# Patient Record
Sex: Male | Born: 1955 | Race: Black or African American | Hispanic: No | Smoking: Former smoker
Health system: Southern US, Community
[De-identification: ages and names within clinical notes are randomized; demographics above are authoritative.]

## PROBLEM LIST (undated history)

## (undated) DIAGNOSIS — R040 Epistaxis: Secondary | ICD-10-CM

## (undated) DIAGNOSIS — E785 Hyperlipidemia, unspecified: Secondary | ICD-10-CM

## (undated) DIAGNOSIS — I10 Essential (primary) hypertension: Secondary | ICD-10-CM

## (undated) DIAGNOSIS — Z9119 Patient's noncompliance with other medical treatment and regimen: Secondary | ICD-10-CM

## (undated) DIAGNOSIS — R55 Syncope and collapse: Secondary | ICD-10-CM

## (undated) DIAGNOSIS — Z91199 Patient's noncompliance with other medical treatment and regimen due to unspecified reason: Secondary | ICD-10-CM

## (undated) DIAGNOSIS — I5042 Chronic combined systolic (congestive) and diastolic (congestive) heart failure: Secondary | ICD-10-CM

## (undated) DIAGNOSIS — R5383 Other fatigue: Secondary | ICD-10-CM

## (undated) DIAGNOSIS — I2510 Atherosclerotic heart disease of native coronary artery without angina pectoris: Secondary | ICD-10-CM

## (undated) DIAGNOSIS — I255 Ischemic cardiomyopathy: Secondary | ICD-10-CM

## (undated) HISTORY — DX: Essential (primary) hypertension: I10

## (undated) HISTORY — DX: Syncope and collapse: R55

## (undated) HISTORY — DX: Ischemic cardiomyopathy: I25.5

## (undated) HISTORY — DX: Other fatigue: R53.83

## (undated) HISTORY — DX: Epistaxis: R04.0

## (undated) HISTORY — DX: Atherosclerotic heart disease of native coronary artery without angina pectoris: I25.10

## (undated) HISTORY — DX: Hyperlipidemia, unspecified: E78.5

---

## 2009-01-24 ENCOUNTER — Inpatient Hospital Stay (HOSPITAL_COMMUNITY): Admission: EM | Admit: 2009-01-24 | Discharge: 2009-01-25 | Payer: Self-pay | Admitting: Emergency Medicine

## 2009-01-24 ENCOUNTER — Encounter (INDEPENDENT_AMBULATORY_CARE_PROVIDER_SITE_OTHER): Payer: Self-pay | Admitting: Internal Medicine

## 2009-02-04 ENCOUNTER — Ambulatory Visit: Payer: Self-pay

## 2009-02-04 ENCOUNTER — Ambulatory Visit: Payer: Self-pay | Admitting: Cardiovascular Disease

## 2009-02-07 ENCOUNTER — Telehealth (INDEPENDENT_AMBULATORY_CARE_PROVIDER_SITE_OTHER): Payer: Self-pay | Admitting: *Deleted

## 2009-02-11 ENCOUNTER — Encounter: Payer: Self-pay | Admitting: Internal Medicine

## 2009-02-11 ENCOUNTER — Ambulatory Visit: Payer: Self-pay

## 2009-03-07 ENCOUNTER — Telehealth (INDEPENDENT_AMBULATORY_CARE_PROVIDER_SITE_OTHER): Payer: Self-pay | Admitting: *Deleted

## 2009-03-15 ENCOUNTER — Encounter (INDEPENDENT_AMBULATORY_CARE_PROVIDER_SITE_OTHER): Payer: Self-pay | Admitting: *Deleted

## 2009-06-05 ENCOUNTER — Encounter (INDEPENDENT_AMBULATORY_CARE_PROVIDER_SITE_OTHER): Payer: Self-pay | Admitting: *Deleted

## 2010-03-02 ENCOUNTER — Inpatient Hospital Stay (HOSPITAL_COMMUNITY): Admission: EM | Admit: 2010-03-02 | Discharge: 2010-03-10 | Payer: Self-pay | Admitting: Emergency Medicine

## 2010-03-02 ENCOUNTER — Ambulatory Visit: Payer: Self-pay | Admitting: Cardiology

## 2010-03-03 ENCOUNTER — Ambulatory Visit: Payer: Self-pay | Admitting: Vascular Surgery

## 2010-03-03 ENCOUNTER — Encounter (INDEPENDENT_AMBULATORY_CARE_PROVIDER_SITE_OTHER): Payer: Self-pay | Admitting: Internal Medicine

## 2010-03-05 ENCOUNTER — Encounter: Payer: Self-pay | Admitting: Cardiology

## 2010-03-06 HISTORY — PX: CORONARY ANGIOPLASTY WITH STENT PLACEMENT: SHX49

## 2010-03-10 HISTORY — PX: CARDIAC CATHETERIZATION: SHX172

## 2010-03-12 ENCOUNTER — Encounter: Payer: Self-pay | Admitting: Cardiovascular Disease

## 2010-04-01 ENCOUNTER — Ambulatory Visit: Payer: Self-pay | Admitting: Internal Medicine

## 2010-04-01 DIAGNOSIS — R5381 Other malaise: Secondary | ICD-10-CM | POA: Insufficient documentation

## 2010-04-01 DIAGNOSIS — R5383 Other fatigue: Secondary | ICD-10-CM

## 2010-04-01 DIAGNOSIS — I10 Essential (primary) hypertension: Secondary | ICD-10-CM | POA: Insufficient documentation

## 2010-04-01 DIAGNOSIS — R55 Syncope and collapse: Secondary | ICD-10-CM | POA: Insufficient documentation

## 2010-04-01 DIAGNOSIS — I255 Ischemic cardiomyopathy: Secondary | ICD-10-CM

## 2010-07-07 ENCOUNTER — Ambulatory Visit: Payer: Self-pay | Admitting: Internal Medicine

## 2010-07-07 DIAGNOSIS — F341 Dysthymic disorder: Secondary | ICD-10-CM | POA: Insufficient documentation

## 2010-07-07 DIAGNOSIS — R040 Epistaxis: Secondary | ICD-10-CM | POA: Insufficient documentation

## 2010-07-25 ENCOUNTER — Ambulatory Visit: Payer: Self-pay | Admitting: Cardiology

## 2010-07-25 ENCOUNTER — Encounter: Payer: Self-pay | Admitting: Internal Medicine

## 2010-07-25 ENCOUNTER — Ambulatory Visit (HOSPITAL_COMMUNITY): Admission: RE | Admit: 2010-07-25 | Discharge: 2010-07-25 | Payer: Self-pay | Admitting: Internal Medicine

## 2010-07-25 ENCOUNTER — Ambulatory Visit: Payer: Self-pay

## 2010-09-22 ENCOUNTER — Encounter: Payer: Self-pay | Admitting: Internal Medicine

## 2010-09-23 ENCOUNTER — Ambulatory Visit: Payer: Self-pay | Admitting: Internal Medicine

## 2010-09-23 DIAGNOSIS — R21 Rash and other nonspecific skin eruption: Secondary | ICD-10-CM

## 2010-12-16 NOTE — Assessment & Plan Note (Signed)
Summary: per check out/sf   Visit Type:  Follow-up Primary Provider:  garba  CC:  no complaints.  History of Present Illness: Frank Webb is seen in followup for ischemic heart disease following revascularization in May.   He had a history of syncope for which he underwent EP testing which was negative in the setting of near normal LV function.  Intercurrent echo showed EF 45-50%  His epistaxis has resolved off the Efient  his mood is better; he is reconciled partially with his Ex  although they're not living to gather. Sexual function is improved    Problems Prior to Update: 1)  Hypertension, Heart Uncontrolled w/o Assoc CHF  (ICD-402.00) 2)  Anxiety Depression  (ICD-300.4) 3)  Epistaxis, Recurrent  (ICD-784.7) 4)  Hypertension, Heart Controlled w/o Assoc CHF  (ICD-402.10) 5)  Fatigue  (ICD-780.79) 6)  Syncope  (ICD-780.2) 7)  Cardiomyopathy, Ischemic  (ICD-414.8)  Current Medications (verified): 1)  Aspirin 325 Mg Tabs (Aspirin) .... Once Daily 2)  Hydrochlorothiazide 25 Mg Tabs (Hydrochlorothiazide) .... Take One Tablet Once Daily 3)  Lisinopril 40 Mg Tabs (Lisinopril) .... Take One Tablet Once Daily 4)  Zocor 40 Mg Tabs (Simvastatin) .... At Bedtime  Allergies (verified): No Known Drug Allergies  Past History:  Past Medical History: Last updated: 04/01/2010 coronary artery disease with prior inferior wall infarct and recent RCA stenting with residual two-vessel disease Ejection fraction 35% Syncope with negative EP study Hyperlipidemi Hypertension sexual dysfunction  Family History: Last updated: 03/25/2009 Father:decease age 31 alzheimers Mother:decease age 20 cancer  Social History: Last updated: 03/25/09 Full Time supervisor at [employer]  Tobacco Use - Former. quit 3.18.10 Alcohol Use - yes Regular Exercise - yes Drug Use - no  Risk Factors: Exercise: yes (2009/03/25)  Risk Factors: Smoking Status: quit (03-25-2009)  Vital Signs:  Patient  profile:   55 year old male Height:      69 inches Weight:      199.25 pounds BMI:     29.53 Pulse rate:   78 / minute BP sitting:   199 / 104  (left arm) Cuff size:   regular  Vitals Entered By: Caralee Ates CMA (September 23, 2010 8:48 AM)  Physical Exam  General:  The patient was alert and oriented in no acute distress. HEENT Normal.  Neck veins were flat, carotids were brisk.  Lungs were clear.  Heart sounds were regular without murmurs or gallops.  Abdomen was soft with active bowel sounds. There is no clubbing cyanosis or edema. Skin Warm and dry; there is a pruritic plaque on the dorsum of his left foot and there are vitiligo on his forearms bilaterally.    EKG  Procedure date:  09/23/2010  Findings:      72 Intervals 0.16/0.11/0.40 Axis of 71 Isolated PVC  Impression & Recommendations:  Problem # 1:  HYPERTENSION, HEART UNCONTROLLED W/O ASSOC CHF (ICD-402.00) we'll begin him on amlodipine 10 mg a day. He is to see his PCP later this month. The following medications were removed from the medication list:    Hydrochlorothiazide 25 Mg Tabs (Hydrochlorothiazide) .Marland Kitchen... Take one tablet once daily    Norvasc 10 Mg Tabs (Amlodipine besylate) ..... Once tablet once daily His updated medication list for this problem includes:    Aspirin 325 Mg Tabs (Aspirin) ..... Once daily    Lisinopril-hydrochlorothiazide 20-12.5 Mg Tabs (Lisinopril-hydrochlorothiazide) .Marland Kitchen..Marland Kitchen Two times a day    Amlodipine Besylate 10 Mg Tabs (Amlodipine besylate) ..... Once daily  Problem # 2:  FATIGUE (ICD-780.79) This is somewhat improved. However, given the difficulties that we've had I would like to hold off on beta blocker therapy for a little while longer and potentially reinitiated  Problem # 3:  CARDIOMYOPATHY, ISCHEMIC (ICD-414.8)  ejection fraction 45-50%. No symptoms we'll continue current medications The following medications were removed from the medication list:    Effient 10 Mg Tabs  (Prasugrel hcl) ..... One tablet once daily    Norvasc 10 Mg Tabs (Amlodipine besylate) ..... Once tablet once daily His updated medication list for this problem includes:    Aspirin 325 Mg Tabs (Aspirin) ..... Once daily    Hydrochlorothiazide 25 Mg Tabs (Hydrochlorothiazide) .Marland Kitchen... Take one tablet once daily    Lisinopril-hydrochlorothiazide 20-12.5 Mg Tabs (Lisinopril-hydrochlorothiazide) .Marland Kitchen..Marland Kitchen Two times a day  The following medications were removed from the medication list:    Hydrochlorothiazide 25 Mg Tabs (Hydrochlorothiazide) .Marland Kitchen... Take one tablet once daily    Effient 10 Mg Tabs (Prasugrel hcl) ..... One tablet once daily    Norvasc 10 Mg Tabs (Amlodipine besylate) ..... Once tablet once daily His updated medication list for this problem includes:    Aspirin 325 Mg Tabs (Aspirin) ..... Once daily    Lisinopril-hydrochlorothiazide 20-12.5 Mg Tabs (Lisinopril-hydrochlorothiazide) .Marland Kitchen..Marland Kitchen Two times a day    Amlodipine Besylate 10 Mg Tabs (Amlodipine besylate) ..... Once daily  Problem # 4:  ANXIETY DEPRESSION (ICD-300.4) Tis somewhat improved. Again as noted above it is in the pause prior to the initiation of a beta blocker which is quite important.  Problem # 5:  RASH AND OTHER NONSPECIFIC SKIN ERUPTION (ICD-782.1) the vitiligo appears to be resolving. I wonder whether as a drug eruption. The pruritic plaque is likely fungal. I suggested that he get OTC antifungal. This doesn't work he should follow up with his primary care physician as his anticipated next few weeks  Patient Instructions: 1)  Your physician has recommended you make the following change in your medication: Amlodipine 10mg  daily, Lisinopril/HCTZ two times a day. Stop HCTZ 2)  Your physician wants you to follow-up in:  6 months.  You will receive a reminder letter in the mail two months in advance. If you don't receive a letter, please call our office to schedule the follow-up appointment. Prescriptions: AMLODIPINE  BESYLATE 10 MG TABS (AMLODIPINE BESYLATE) once daily  #90 x 3   Entered by:   Claris Gladden RN   Authorized by:   Nathen May, MD, [facility]   Signed by:   Claris Gladden RN on 09/23/2010   Method used:   Electronically to        [facility] [address] (retail)       [phone]       [place], [place]  [address]       Ph: [phone]       Fax: [phone]   RxID:   [id number] LISINOPRIL-HYDROCHLOROTHIAZIDE 20-12.5 MG TABS (LISINOPRIL-HYDROCHLOROTHIAZIDE) two times a day  #180 x 3   Entered by:   Claris Gladden RN   Authorized by:   Nathen May, MD, [facility]   Signed by:   Claris Gladden RN on 09/23/2010   Method used:   Electronically to        [facility] [address] (retail)       [phone]       [place], [place]  [address]  Ph: [phone]       Fax: [phone]   RxID:   [id number]

## 2010-12-16 NOTE — Assessment & Plan Note (Signed)
Summary: appt @ 9:00/eph/jml   CC:  eph/pt states he has not been doing so well.  He is having headaches frequently and has no energy. Marland Kitchen  History of Present Illness: Frank Webb is seen in followup for syncope in the setting of ischemic heart disease prior myocardial infarction and recent PCI of his RCA with tandem drug-eluting stents. He also underwent EP testing which was negative and he was discharged.  His post discharge course has been complicated by persistent right-sided headaches with nasal drainage as well as fatigue loss of libido and somnolence.  Review of his medications demonstrates that carvedilol amlodipine spironolactone and prsugrel are new; he inadvertently resumed his labetalol at home and has tolerated it in the past as well as his lisinopril and hctz    Current Medications (verified): 1)  Aspirin 325 Mg Tabs (Aspirin) .... Once Daily 2)  Zocor 40 Mg Tabs (Simvastatin) .... Take One Tablet Once Daily 3)  Hydrochlorothiazide 25 Mg Tabs (Hydrochlorothiazide) .... Take One Tablet Once Daily 4)  Lisinopril 40 Mg Tabs (Lisinopril) .... Take One Tablet Once Daily 5)  Effient 10 Mg Tabs (Prasugrel Hcl) .... One Tablet Once Daily 6)  Norvasc 10 Mg Tabs (Amlodipine Besylate) .... Once Tablet Once Daily 7)  Coreg 6.25 Mg Tabs (Carvedilol) .... Take One Tablet Two Times A Day 8)  Spironolactone 25 Mg Tabs (Spironolactone) .... Take One Tablet Once Daily 9)  Labetalol Hcl 100 Mg Tabs (Labetalol Hcl) .... 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 quit 3.18.10 Alcohol Use - yes Regular Exercise - yes Drug Use - no  Past Medical History: coronary artery disease with prior inferior wall infarct and recent RCA stenting  with residual two-vessel disease Ejection fraction 35% Syncope with negative EP study Hyperlipidemi Hypertension sexual dysfunction  Vital Signs:  Patient profile:   55 year old male Height:      69 inches Weight:      192 pounds BMI:     28.46 Pulse rate:   62 / minute Pulse rhythm:   regular BP sitting:   116 / 74  (left arm) Cuff size:   regular  Vitals Entered By: Judithe Modest CMA (Apr 01, 2010 8:47 AM)  Physical Exam  General:  Well developed, well nourished, in no acute distress. Head:  normal  HEENT Neck:  Neck supple,  Lungs:  clear  Heart:  regular rate and rhythm without murmur throat L. Abdomen:  soft with active bowel sounds Msk:  Back normal, normal gait. Muscle strength and tone normal. Pulses:  pulses normal in all 4 extremities Extremities:  no clubbing cyanosis or edema Neurologic:  grossly normal Skin:  warm and dry Psych:  depressed affect.  depressed affect.     EKG  Procedure date:  04/01/2010  Findings:      sinus rhythm at 62 Intervals 0.17/0.11/0.38 Axis LXVIII  Impression & Recommendations:  Problem # 1:  CARDIOMYOPATHY, ISCHEMIC (ICD-414.8) the patient is having significant symptoms tolerating his medications for his ischemic myopathy. We will hold his Aldactone at this point. We'll switch her from carvedilol which is new to labetalol which she had previously tolerated. Right now we will continue him on his amlodipine.  Problem # 2:  HYPERTENSION, HEART CONTROLLED W/O ASSOC CHF (ICD-402.10)  Will continue his labetalol lisinopril and HCTZ and Norvasc for now. Hopefully the discontinuation of his carvedilol aspirin lactone will not adversely affect his blood pressure His updated medication list for this problem includes:    Aspirin 325 Mg Tabs (Aspirin) ..... Once daily    Hydrochlorothiazide 25 Mg Tabs (Hydrochlorothiazide) .Marland Kitchen... Take one tablet once daily    Lisinopril 40 Mg Tabs (Lisinopril) .Marland Kitchen... Take one tablet once daily     Norvasc 10 Mg Tabs (Amlodipine besylate) ..... Once tablet once daily    Spironolactone 25 Mg Tabs (Spironolactone) .Marland Kitchen... Take one tablet once daily- hold  Problem # 3:  UNSPECIFIED SINUSITIS (ICD-473.9) begin amoxicillin x7 days  Problem # 4:  SYNCOPE (ICD-780.2) no intercurrent events His updated medication list for this problem includes:    Aspirin 325 Mg Tabs (Aspirin) ..... Once daily    Lisinopril 40 Mg Tabs (Lisinopril) .Marland Kitchen... Take one tablet once daily    Effient 10 Mg Tabs (Prasugrel hcl) ..... One tablet once daily    Norvasc 10 Mg Tabs (Amlodipine besylate) ..... Once tablet once daily  Problem # 5:  FATIGUE (ICD-780.79) I hope that this is a medication effect related to the carvedilol. We'll attempt to use the labetalol as an alternative. He slid a stone next couple of weeks how he is feeling.  Other Orders: Echocardiogram (Echo)  Patient Instructions: 1)  Your physician has recommended you make the following change in your medication: Stop Carvedilol, Stop Spironolactone for now.  Start Amoxicillin 500mg  1 capsule every 8 hours for 7 days. 2)  Your physician recommends that you schedule a follow-up appointment in: 3 months with Dr Graciela Husbands with repeat echocardiogram. Prescriptions: NORVASC 10 MG TABS (AMLODIPINE BESYLATE) once tablet once daily  #30 x 11   Entered by:   Optometrist BSN   Authorized by:   Nathen May, MD, [facility]   Signed by:   Gypsy Balsam RN BSN on 04/01/2010   Method used:   Tax adviser to        [facility] (retail)       [phone]       [place], [place]  [address]       Ph: [phone]       Fax: [phone]   RxID:   [id number] EFFIENT 10 MG TABS (PRASUGREL HCL) one tablet once daily  #30 x 11   Entered by:   Optometrist BSN   Authorized by:   Nathen May, MD, [facility]   Signed by:   Gypsy Balsam RN BSN on 04/01/2010   Method used:   Tax adviser to        [facility] [employer] [facility] (retail)       [phone]       [place], [place]  [address]       Ph: [phone]       Fax: [phone]   RxID:   [id number] ZOCOR 40 MG TABS (SIMVASTATIN) take one tablet once daily  #30 x 11   Entered by:   Optometrist BSN   Authorized by:   Nathen May, MD, [facility]   Signed by:   Gypsy Balsam RN BSN on 04/01/2010   Method used:   Tax adviser to        [facility] (retail)       [phone]  [place], [place]  [address]       Ph: [phone]       Fax: [phone]   RxID:   [id number] AMOXICILLIN 500 MG CAPS (AMOXICILLIN) Take 1 capsule every 8 hours for 7 days  #21 x 0   Entered by:   Optometrist BSN   Authorized by:   Nathen May, MD, [facility]   Signed by:   Gypsy Balsam RN BSN on 04/01/2010   Method used:   Tax adviser to        [facility] (retail)       [phone]       [place], [place]  [address]       Ph: [phone]       Fax: [phone]   RxID:   [id number]

## 2010-12-16 NOTE — Assessment & Plan Note (Signed)
Summary: 3 month rov echo at 8:30/sl   Primary Provider:  garba  CC:  pt having nosebleeds x 3.  He says last occured 2 weeks ago.  He reports social stress.Marland Kitchen  History of Present Illness: Frank Webb is seen in followup for ischemic heart disease following revascularization in May. He has a series of complaints today the first of which is epistaxis Reviewing DrMcAlhaney notes his recommendation was that his  Effient could be stopped after a month.  He has also been struggling at home. His fiance left in the context of him being sick and sexually inactive. He has started smoking again and described himself as being quite depressed  Current Medications (verified): 1)  Aspirin 325 Mg Tabs (Aspirin) .... Once Daily 2)  Hydrochlorothiazide 25 Mg Tabs (Hydrochlorothiazide) .... Take One Tablet Once Daily 3)  Lisinopril 40 Mg Tabs (Lisinopril) .... Take One Tablet Once Daily 4)  Effient 10 Mg Tabs (Prasugrel Hcl) .... One Tablet Once Daily 5)  Norvasc 10 Mg Tabs (Amlodipine Besylate) .... Once Tablet Once Daily 6)  Zocor 40 Mg Tabs (Simvastatin) .... At Bedtime  Allergies (verified): No Known Drug Allergies  Past History:  Past Medical History: Last updated: 04/01/2010 coronary artery disease with prior inferior wall infarct and recent RCA stenting with residual two-vessel disease Ejection fraction 35% Syncope with negative EP study Hyperlipidemi Hypertension sexual dysfunction  Family History: Last updated: 03-17-2009 Father:decease age 77 alzheimers Mother:decease age 79 cancer  Social History: Last updated: 03-17-2009 Full Time supervisor at [employer]  Tobacco Use - Former. quit 3.18.10 Alcohol Use - yes Regular Exercise - yes Drug Use - no  Vital Signs:  Patient profile:   55 year old male Height:      69 inches Weight:      190 pounds BMI:     28.16 Pulse rate:   75 / minute BP sitting:   180 / 98  (left arm) Cuff size:   regular  Vitals Entered By: Judithe Modest CMA (July 07, 2010 10:03 AM)  Physical Exam  General:  Well developed, well nourished, in no acute distress. Head:  normal  HEENT Neck:  Neck supple,  Lungs:  clear  Heart:  regular rate and rhythm without murmur throat L. Abdomen:  soft with active bowel sounds Msk:  Back normal, normal gait. Muscle strength and tone normal. Extremities:  no clubbing cyanosis or edema Neurologic:  grossly normal, alert and oriented x3 Skin:  Intact without lesions or rashes. Psych:  depressed affect.     EKG  Procedure date:  07/07/2010  Findings:      sinus rhythm at 75 Intervals 0.16 5.10 6.3 Axis is 51 poor R-wave  Impression & Recommendations:  Problem # 1:  EPISTAXIS, RECURRENT (ICD-784.7) the patient has had nosebleeds. Will plan to discontinue his ST and not aware 3 months post intervention. He'll be continued on aspirin  Problem # 2:  HYPERTENSION, HEART CONTROLLED W/O ASSOC CHF (ICD-402.10)  his blood pressure is quite high today. When he saw his primary care physician last week it was 138 though he says. Because of that we will hold off on any current adjustments. I would suggest we followed up with his primary care physician that he has elevated blood pressure that we should try a beta blocker given his history of coronary The following medications were removed from the medication list:    Spironolactone 25 Mg Tabs (Spironolactone) .Marland Kitchen... Take one tablet once daily- hold His updated medication list  for this problem includes:    Aspirin 325 Mg Tabs (Aspirin) ..... Once daily    Hydrochlorothiazide 25 Mg Tabs (Hydrochlorothiazide) .Marland Kitchen... Take one tablet once daily    Lisinopril 40 Mg Tabs (Lisinopril) .Marland Kitchen... Take one tablet once daily    Norvasc 10 Mg Tabs (Amlodipine besylate) ..... Once tablet once daily  Orders: EKG w/ Interpretation (93000)  Problem # 3:  ANXIETY DEPRESSION (ICD-300.4) his affect is flat and he admits to depression I suggest he followup with his  primary care physician to consider antidepressant therapy for 3-6 months. Sexual dysfunction is clearly contributing to this and it may be improved with his depression resolving. Otherwise may need further intervention  Problem # 4:  CARDIOMYOPATHY, ISCHEMIC (ICD-414.8) stable from a coronary point of view The following medications were removed from the medication list:    Spironolactone 25 Mg Tabs (Spironolactone) .Marland Kitchen... Take one tablet once daily- hold His updated medication list for this problem includes:    Aspirin 325 Mg Tabs (Aspirin) ..... Once daily    Hydrochlorothiazide 25 Mg Tabs (Hydrochlorothiazide) .Marland Kitchen... Take one tablet once daily    Lisinopril 40 Mg Tabs (Lisinopril) .Marland Kitchen... Take one tablet once daily    Effient 10 Mg Tabs (Prasugrel hcl) ..... One tablet once daily    Norvasc 10 Mg Tabs (Amlodipine besylate) ..... Once tablet once daily  Orders: Echocardiogram (Echo)  Patient Instructions: 1)  Your physician recommends that you schedule a follow-up appointment in: 3 MONTHS WITH DR Graciela Husbands 2)  Your physician has recommended you make the following change in your medication: STOP EFFIENT 3)  Your physician has requested that you have an echocardiogram.  Echocardiography is a painless test that uses sound waves to create images of your heart. It provides your doctor with information about the size and shape of your heart and how well your heart's chambers and valves are working.  This procedure takes approximately one hour. There are no restrictions for this procedure.

## 2010-12-16 NOTE — Miscellaneous (Signed)
Summary: [facility] Cardiac Progress Note  [facility] Card[facility] Progress Note   Imported By: Roderic Ovens 04/16/2010 10:59:39  _____________________________________________________________________  External Attachment:    Type:   Image     Comment:   External Document

## 2010-12-16 NOTE — Miscellaneous (Signed)
  Clinical Lists Changes  Observations: Added new observation of ECHOINTERP:   Study Conclusions    - Left ventricle: The cavity size was normal. Wall thickness was     increased in a pattern of moderate LVH. Systolic function was     mildly reduced. The estimated ejection fraction was in the range     of 45% to 50%. Mild posterior hypokinesis, basal inferior severe     hypokinesis. Doppler parameters are consistent with abnormal left     ventricular relaxation (grade 1 diastolic dysfunction).   - Aortic valve: There was no stenosis.   - Mitral valve: Mild regurgitation.   - Left atrium: The atrium was mildly dilated.   - Right ventricle: The cavity size was normal. Systolic function was     normal.   - Pulmonary arteries: No complete TR doppler jet so unable to     estimate PA systolic pressure.   - Inferior vena cava: The vessel was normal in size; the     respirophasic diameter changes were in the normal range (= 50%);     findings are consistent with normal central venous pressure.   Impressions:    - Normal LV size with moderate hypertrophy. EF 45-50% with mild     posterior hypokinesis and basal inferior severe hypokinesis.     Normal RV size and systolic function.   Prepared and Electronically Authenticated by    Marca Ancona, MD  (07/25/2010 10:11)      Echocardiogram  Procedure date:  07/25/2010  Findings:        Study Conclusions    - Left ventricle: The cavity size was normal. Wall thickness was     increased in a pattern of moderate LVH. Systolic function was     mildly reduced. The estimated ejection fraction was in the range     of 45% to 50%. Mild posterior hypokinesis, basal inferior severe     hypokinesis. Doppler parameters are consistent with abnormal left     ventricular relaxation (grade 1 diastolic dysfunction).   - Aortic valve: There was no stenosis.   - Mitral valve: Mild regurgitation.   - Left atrium: The atrium was mildly dilated.   -  Right ventricle: The cavity size was normal. Systolic function was     normal.   - Pulmonary arteries: No complete TR doppler jet so unable to     estimate PA systolic pressure.   - Inferior vena cava: The vessel was normal in size; the     respirophasic diameter changes were in the normal range (= 50%);     findings are consistent with normal central venous pressure.   Impressions:    - Normal LV size with moderate hypertrophy. EF 45-50% with mild     posterior hypokinesis and basal inferior severe hypokinesis.     Normal RV size and systolic function.   Prepared and Electronically Authenticated by    Marca Ancona, MD

## 2010-12-16 NOTE — Miscellaneous (Signed)
Summary: Appointment Canceled  Appointment status changed to canceled by [employer] on 07/07/2010 10:27 AM.  Cancellation Comments --------------------- echo dx 414.8/bcbs pending/sl  Appointment Information ----------------------- Appt Type:  CARDIOLOGY ANCILLARY VISIT      Date:  Monday, July 07, 2010      Time:  8:30 AM for 60 min   Urgency:  Routine   Made By:  Hoy Finlay Scheduler  To Visit:  [id number]    Reason:  echo dx 414.8/bcbs pending/sl  Appt Comments ------------- -- 07/07/10 10:27: ([facility]) CANCELED -- echo dx 414.8/bcbs pending/sl -- 04/01/10 10:28: ([facility]) BOOKED -- Routine CARDIOLOGY ANCILLARY VISIT at 07/07/2010 8:30 AM for 60 min echo dx 414.8/bcbs pending/sl -- 04/01/10 9:34: ([facility]) BOOKED -- Routine CARDIOL

## 2011-02-03 LAB — CBC
HCT: 41.1 % (ref 39.0–52.0)
HCT: 44.1 % (ref 39.0–52.0)
MCHC: 32.8 g/dL (ref 30.0–36.0)
MCV: 78.3 fL (ref 78.0–100.0)
MCV: 78.3 fL (ref 78.0–100.0)
MCV: 78.4 fL (ref 78.0–100.0)
Platelets: 215 10*3/uL (ref 150–400)
Platelets: 231 10*3/uL (ref 150–400)
Platelets: 238 10*3/uL (ref 150–400)
Platelets: 257 10*3/uL (ref 150–400)
Platelets: 272 10*3/uL (ref 150–400)
RBC: 5.71 MIL/uL (ref 4.22–5.81)
RDW: 15.5 % (ref 11.5–15.5)
RDW: 16 % — ABNORMAL HIGH (ref 11.5–15.5)
WBC: 7.1 10*3/uL (ref 4.0–10.5)
WBC: 7.1 10*3/uL (ref 4.0–10.5)

## 2011-02-03 LAB — BASIC METABOLIC PANEL
BUN: 10 mg/dL (ref 6–23)
BUN: 10 mg/dL (ref 6–23)
BUN: 12 mg/dL (ref 6–23)
BUN: 6 mg/dL (ref 6–23)
BUN: 7 mg/dL (ref 6–23)
CO2: 30 mEq/L (ref 19–32)
CO2: 30 mEq/L (ref 19–32)
Calcium: 8.8 mg/dL (ref 8.4–10.5)
Calcium: 9 mg/dL (ref 8.4–10.5)
Calcium: 9.2 mg/dL (ref 8.4–10.5)
Chloride: 102 mEq/L (ref 96–112)
Chloride: 103 mEq/L (ref 96–112)
Chloride: 103 mEq/L (ref 96–112)
Chloride: 104 mEq/L (ref 96–112)
Creatinine, Ser: 1 mg/dL (ref 0.4–1.5)
Creatinine, Ser: 1.01 mg/dL (ref 0.4–1.5)
Creatinine, Ser: 1.07 mg/dL (ref 0.4–1.5)
Creatinine, Ser: 1.1 mg/dL (ref 0.4–1.5)
Creatinine, Ser: 1.13 mg/dL (ref 0.4–1.5)
Creatinine, Ser: 1.23 mg/dL (ref 0.4–1.5)
GFR calc Af Amer: >60 mL/min (ref >60)
GFR calc Af Amer: >60 mL/min (ref >60)
GFR calc Af Amer: >60 mL/min (ref >60)
GFR calc non Af Amer: >60 mL/min (ref >60)
GFR calc non Af Amer: >60 mL/min (ref >60)
GFR calc non Af Amer: >60 mL/min (ref >60)
GFR calc non Af Amer: >60 mL/min (ref >60)
Glucose, Bld: 126 mg/dL — ABNORMAL HIGH (ref 70–99)
Glucose, Bld: 94 mg/dL (ref 70–99)
Glucose, Bld: 95 mg/dL (ref 70–99)
Potassium: 3.7 mEq/L (ref 3.5–5.1)
Sodium: 137 mEq/L (ref 135–145)
Sodium: 138 mEq/L (ref 135–145)

## 2011-02-03 LAB — POCT CARDIAC MARKERS
CKMB, poc: 1.9 ng/mL (ref 1.0–8.0)
Myoglobin, poc: 100 ng/mL (ref 12–200)
Troponin i, poc: <0.05 ng/mL (ref 0.00–0.09)

## 2011-02-03 LAB — CK TOTAL AND CKMB (NOT AT ARMC): CK, MB: 2.5 ng/mL (ref 0.3–4.0)

## 2011-02-03 LAB — DIFFERENTIAL
Basophils Relative: 0 % (ref 0–1)
Blasts: 0 %
Lymphocytes Relative: 33 % (ref 12–46)
Lymphs Abs: 2.3 10*3/uL (ref 0.7–4.0)
Metamyelocytes Relative: 0 %
Monocytes Absolute: 0.9 10*3/uL (ref 0.1–1.0)
Myelocytes: 0 %
Neutro Abs: 3.8 10*3/uL (ref 1.7–7.7)
Neutrophils Relative %: 52 % (ref 43–77)
nRBC: 0 /100 WBC

## 2011-02-03 LAB — LIPID PANEL
HDL: 36 mg/dL — ABNORMAL LOW (ref >39)
LDL Cholesterol: 170 mg/dL — ABNORMAL HIGH (ref 0–99)
Total CHOL/HDL Ratio: 6.4 RATIO
Triglycerides: 121 mg/dL (ref <150)
VLDL: 24 mg/dL (ref 0–40)

## 2011-02-03 LAB — URINE DRUGS OF ABUSE SCREEN W ALC, ROUTINE (REF LAB)
Benzodiazepines.: NEGATIVE
Marijuana Metabolite: NEGATIVE
Opiate Screen, Urine: NEGATIVE
Phencyclidine (PCP): NEGATIVE
Propoxyphene: NEGATIVE

## 2011-02-03 LAB — TSH: TSH: 1.601 u[IU]/mL (ref 0.350–4.500)

## 2011-02-03 LAB — URINALYSIS, ROUTINE W REFLEX MICROSCOPIC
Bilirubin Urine: NEGATIVE
Nitrite: NEGATIVE

## 2011-02-03 LAB — CARDIAC PANEL(CRET KIN+CKTOT+MB+TROPI)
CK, MB: 3.5 ng/mL (ref 0.3–4.0)
Relative Index: 1.7 (ref 0.0–2.5)
Troponin I: 0.09 ng/mL — ABNORMAL HIGH (ref 0.00–0.06)
Troponin I: 0.09 ng/mL — ABNORMAL HIGH (ref 0.00–0.06)

## 2011-02-03 LAB — MAGNESIUM
Magnesium: 2.2 mg/dL (ref 1.5–2.5)
Magnesium: 2.3 mg/dL (ref 1.5–2.5)

## 2011-02-03 LAB — URINE CULTURE: Colony Count: NO GROWTH

## 2011-02-03 LAB — URINE MICROSCOPIC-ADD ON

## 2011-02-03 LAB — HEPATIC FUNCTION PANEL
Total Bilirubin: 0.6 mg/dL (ref 0.3–1.2)
Total Protein: 6.8 g/dL (ref 6.0–8.3)

## 2011-02-03 LAB — ALDOSTERONE + RENIN ACTIVITY W/ RATIO
ALDO / PRA Ratio: 42.1 Ratio — ABNORMAL HIGH (ref 0.9–28.9)
Aldosterone: 8 ng/dL

## 2011-02-03 LAB — PROTIME-INR
INR: 0.98 (ref 0.00–1.49)
Prothrombin Time: 12.9 seconds (ref 11.6–15.2)

## 2011-02-03 LAB — TROPONIN I: Troponin I: 0.09 ng/mL — ABNORMAL HIGH (ref 0.00–0.06)

## 2011-02-26 LAB — BASIC METABOLIC PANEL
Calcium: 9.2 mg/dL (ref 8.4–10.5)
GFR calc Af Amer: >60 mL/min (ref >60)
GFR calc non Af Amer: >60 mL/min (ref >60)
Potassium: 3.8 mEq/L (ref 3.5–5.1)
Sodium: 140 mEq/L (ref 135–145)

## 2011-02-26 LAB — POCT CARDIAC MARKERS
CKMB, poc: 1.5 ng/mL (ref 1.0–8.0)
Myoglobin, poc: 56.6 ng/mL (ref 12–200)
Troponin i, poc: <0.05 ng/mL (ref 0.00–0.09)

## 2011-02-26 LAB — CARDIAC PANEL(CRET KIN+CKTOT+MB+TROPI)
CK, MB: 2 ng/mL (ref 0.3–4.0)
CK, MB: 2.2 ng/mL (ref 0.3–4.0)
Total CK: 112 U/L (ref 7–232)
Total CK: 116 U/L (ref 7–232)
Troponin I: 0.1 ng/mL — ABNORMAL HIGH (ref 0.00–0.06)

## 2011-02-26 LAB — DIFFERENTIAL
Basophils Absolute: 0 10*3/uL (ref 0.0–0.1)
Basophils Relative: 0 % (ref 0–1)
Eosinophils Absolute: 0.1 10*3/uL (ref 0.0–0.7)
Eosinophils Relative: 1 % (ref 0–5)
Lymphocytes Relative: 27 % (ref 12–46)
Lymphocytes Relative: 49 % — ABNORMAL HIGH (ref 12–46)
Monocytes Relative: 8 % (ref 3–12)
Neutro Abs: 3.1 10*3/uL (ref 1.7–7.7)
Neutro Abs: 3.7 10*3/uL (ref 1.7–7.7)
Neutrophils Relative %: 42 % — ABNORMAL LOW (ref 43–77)

## 2011-02-26 LAB — CBC
HCT: 43.3 % (ref 39.0–52.0)
HCT: 48.2 % (ref 39.0–52.0)
Hemoglobin: 15.4 g/dL (ref 13.0–17.0)
MCHC: 32.3 g/dL (ref 30.0–36.0)
MCV: 77.4 fL — ABNORMAL LOW (ref 78.0–100.0)
Platelets: 223 10*3/uL (ref 150–400)
RBC: 6.18 MIL/uL — ABNORMAL HIGH (ref 4.22–5.81)
WBC: 6.2 10*3/uL (ref 4.0–10.5)
WBC: 7.4 10*3/uL (ref 4.0–10.5)

## 2011-02-26 LAB — COMPREHENSIVE METABOLIC PANEL
Albumin: 3.5 g/dL (ref 3.5–5.2)
BUN: 8 mg/dL (ref 6–23)
CO2: 27 mEq/L (ref 19–32)
Calcium: 8.8 mg/dL (ref 8.4–10.5)
Chloride: 103 mEq/L (ref 96–112)
Creatinine, Ser: 1.04 mg/dL (ref 0.4–1.5)
GFR calc non Af Amer: >60 mL/min (ref >60)
Total Bilirubin: 1 mg/dL (ref 0.3–1.2)

## 2011-02-26 LAB — HEMOGLOBIN A1C: Hgb A1c MFr Bld: 5.6 % (ref 4.6–6.1)

## 2011-02-26 LAB — LIPID PANEL
HDL: 37 mg/dL — ABNORMAL LOW (ref >39)
Total CHOL/HDL Ratio: 6.7 RATIO
VLDL: 22 mg/dL (ref 0–40)

## 2011-02-26 LAB — ETHANOL: Alcohol, Ethyl (B): <5 mg/dL (ref 0–10)

## 2011-03-31 NOTE — Discharge Summary (Signed)
NAMEMARKE, Frank Webb                 ACCOUNT NO.:  [id number]   MEDICAL RECORD NO.:  [id number]          PATIENT TYPE:  INP   LOCATION:  1240                         FACILITY:  [facility]   PHYSICIAN:  Hillery Aldo, M.D.   DATE OF BIRTH:  1956/05/18   DATE OF ADMISSION:  01/23/2009  DATE OF DISCHARGE:  01/25/2009                               DISCHARGE SUMMARY   PRIMARY CARE PHYSICIAN:  None.   The patient will be referred to Dr. Lonia Blood for primary care and to  Dr. Eden Emms for cardiology care.   DISCHARGE DIAGNOSES:  1. Hypertensive urgency.  2. Dyslipidemia.  3. Alcohol dependency.  4. Elevated troponin I.  5. Diastolic dysfunction.  6. Tobacco abuse.  7. Nonspecific T-wave abnormality on 12-lead EKG testing.   DISCHARGE MEDICATIONS:  1. Aspirin 81 mg p.o. daily.  2. Hydrochlorothiazide 25 mg p.o. daily.  3. Lisinopril 10 mg p.o. daily.  4. Labetalol 200 mg p.o. q.12 h  5. Zocor 40 mg p.o. q.p.m.   CONSULTATIONS:  None.   BRIEF ADMISSION HISTORY OF PRESENT ILLNESS:  The patient is a 55-year-  old male with past medical history of elevated blood pressure who  presented to the emergency department in hypertensive urgency with a  blood pressure documented at 250/134.  The patient had not been taking  his medications and was not currently under the care of primary care  physician.  He had been on hydrochlorothiazide and ACE inhibitor but  discontinued these back in August 2009.  Upon initial evaluation, the  patient was put on an IV labetalol drip and admitted to the hospitalist  service.  For full details, please see the dictated report done by Dr.  Ninfa Linden.   PROCEDURES AND DIAGNOSTIC STUDIES:  1. Chest x-ray on January 24, 2009, showed mild cardiomegaly, slight      hyperaeration, no active lung disease.  2. Two-dimensional echocardiogram done on January 24, 2009, showed      normal left ventricular systolic function with ejection fraction      estimated 55-60%.  No left  ventricular regional wall motion      abnormality.  Left ventricular wall thickness was moderately      increased.  Doppler parameters were consistent with abnormal left      ventricular relaxation.  There was mild mitral valvular      regurgitation.   DISCHARGE LABORATORY VALUES:  CK was 112, CK-MB 2.0, troponin I 0.09.  TSH was 2.406.  Hemoglobin A1c was 5.6%.  Lipids showed a cholesterol of  248, triglycerides 112, HDL 37, LDL 189.  Liver function studies were  within normal limits.   HOSPITAL COURSE:  1. Hypertensive urgency:  The patient was initially put on a labetalol      drip.  This was titrated off, and the patient's usual      antihypertensives were resumed including hydrochlorothiazide and      lisinopril.  The patient's blood pressure steadily rose, and he was      put on p.o. labetalol and will need close follow-up.  He has been  obstructed to call Dr. Maxwell Caul office for an appointment time to be      seen within one week.  Additionally, Dr. Mikeal Hawthorne was left a message      regarding contacting this patient for follow-up.  2. Dyslipidemia:  The patient's dyslipidemia has been addressed with      dietitian counseling and statin therapy.  He will need a follow-up      fasting lipid panel and liver function studies in six week's time      done at Dr. Maxwell Caul office.  The patient has been advised of this.  3. Alcohol dependency.  The patient is a heavy alcohol user, but did      not develop any signs of withdrawal.  He is provided with Ativan as-      needed, but did not require any of this.  He was also supplemented      with thiamine and folic acid.  4. Elevated troponin I.  The patient denied any chest pain and his      troponin I elevation was likely due to diastolic dysfunction in the      setting of hypertensive urgency.  A two-dimensional echocardiogram      did not reveal any left ventricular dysfunction or regional wall      motion abnormalities.  Nevertheless,  given his significant risk      factors being male sex, untreated hypertension, and dyslipidemia, I      have set him up for an outpatient stress test with Dr. Eden Emms.  Of      note, the patient also had a 12-lead EKG that showed normal sinus      rhythm with premature ventricular complexes or fusion complexes,      and nonspecific T-wave abnormalities.  Specifically, there were T-      wave inversions in V5 and V6 as well as lead I and aVL.  5. Tobacco abuse.  The patient was counseled on the importance of      tobacco cessation given his underlying comorbidities.  He was      offered a nicotine patch but declined this.  He has been encouraged      to maintain smoking cessation efforts.   DISPOSITION:  The patient is medically stable and will be discharged  home.  He has hospital follow-up with Dr. Eden Emms already scheduled for  stress testing on March 22 at 3:15 p.m.  He will call Dr. Mikeal Hawthorne for a  follow-up appointment time and to establish primary care.   Time spent coordinating care for discharge and discharge instructions  equals 35 minutes.      Hillery Aldo, M.D.  Electronically Signed     CR/MEDQ  D:  01/25/2009  T:  01/25/2009  Job:  [id number]   cc:   Lonia Blood, M.D.   Noralyn Pick. Eden Emms, MD, [facility]  [address]  [address]  [place]  [place] [address]

## 2011-03-31 NOTE — H&P (Signed)
NAMEROEN, MACGOWAN                 ACCOUNT NO.:  [id number]   MEDICAL RECORD NO.:  [id number]          PATIENT TYPE:  EMS   LOCATION:  ED                           FACILITY:  [facility]   PHYSICIAN:  Oswald Hillock, MD        DATE OF BIRTH:  1956-01-04   DATE OF ADMISSION:  01/23/2009  DATE OF DISCHARGE:                              HISTORY & PHYSICAL   PRIMARY CARE PHYSICIAN:  None.   CHIEF COMPLAINT:  Elevated blood pressure.   HISTORY OF PRESENT ILLNESS:  The patient is a 55 year old African  American male with longstanding history of hypertension who has been  noncompliant with his medications since August of last year.  He  presents to the emergency room after he went to his primary care  physician's office today to get prescriptions renewed for high blood  pressure.  However, he was found to have a blood pressure of 250/134,  and was referred here for further evaluation and management.  In the  emergency room, his initial systolic blood pressure was more than 240.  He was given multiple IV boluses of labetalol and started on an IV drip.  At the time of this interview, his blood pressure is significantly  improved with a systolic of 180.  The patient does not report any chest  pain, shortness of breath, palpitations, dizziness, diaphoresis, loss of  consciousness or any focal weakness of any part of the body and had none  of these symptoms earlier today either.  The patient states that he has  been essentially asymptomatic since going off medications in August  2009.   PAST MEDICAL HISTORY:  Hypertension.   PAST SURGICAL HISTORY:  None.   CURRENT MEDICATIONS:  None.   ALLERGIES:  NO KNOWN DRUG ALLERGIES.   FAMILY HISTORY:  Significant for diabetes and hypertension.   SOCIAL HISTORY:  The patient smokes a pack per day and has a 20 pack-  year history of smoking.  He drinks alcohol on a daily basis, especially  in the recent past.  No history of drug use.  He lives with family  and  works for [employer].   REVIEW OF SYSTEMS:  An extensive review of systems was done and all  systems were negative except for the positives mentioned in the history  of present illness.   PHYSICAL EXAMINATION:  VITAL SIGNS:  On admission, pulse of 84, blood  pressure 243/138, respiratory rate of 20, temperature of 98.1.  O2 sats  of 98% on 2 liters nasal cannula.  GENERAL:  The patient is conscious, alert and oriented to time, place  and person in no acute distress.  HEENT:  No scleral icterus.  No  conjunctival congestion.  No pallor.  Ears negative.  Poor dental  hygiene.  NECK:  Supple.  No lymphadenopathy.  No JVD.  No carotid bruit.  CHEST:  CTA bilaterally.  CVS:  S1-S2 regular.  No gallop, rub or murmur appreciated.  ABDOMEN:  Soft, nontender, nondistended.  Bowel sounds present.  Obese.  EXTREMITIES:  No cyanosis, clubbing or edema.  NEUROLOGIC:  Cranial nerves II-XII are grossly intact.  No focal motor  or sensory deficits noted on gross examination.  Fundus examination did  not reveal any hemorrhages.  Optic disks was clear.   LABORATORY DATA:  WBC count 7.4, hemoglobin 15.4, hematocrit 48.2,  platelet count of 254.  Sodium 140, potassium 3.8, chloride 106, CO2 of  25, glucose 96, BUN 9, creatinine 1.06.  BNP was 75.  Myoglobin 56.6, CK-  MB 1.5, troponin less than 0.05.  EKG showed sinus rhythm with  occasional PVCs, normal PR interval, widened QRS complex, poor R-wave  progression in anterior leads, T inversions in leads V5-V6.  No previous  EKGs available for comparison.  Chest x-ray pending.   IMPRESSION AND PLAN:  This is the case of a 55 year old African American  male with history of hypertension, noncompliance with medications who  presents with hypertensive urgency.   1. Hypertensive urgency.  The patient has responded well to      intravenous labetalol and is currently on a drip.  As he is      asymptomatic, we will continue him on labetalol at 2 mg per  minute,      titrate him to a systolic blood pressure of less than 150, hold for      heart rate less than 55 and recycle his enzymes.  Initial enzymes      were negative.  The patient was on lisinopril/hydrochlorothiazide      in the past.  We will restart him, give him a dose now and the      patient will be titrated off labetalol by giving him a loading dose      of p.o. labetalol.  Will monitor the patient closely on telemetry      overnight and make further recommendations based on how he does.  2. Alcohol abuse.  The patient has been drinking alcohol on a daily      basis of late.  We will check his alcohol level, put him on Ativan      for possible DT prophylaxis.  Give him a dose of thiamine and      folate and monitor closely.  3. Health maintenance.  We will check his fasting lipid panel and      hemoglobin A1c especially given his family history of diabetes      mellitus and a TSH level.  Follow up with the results.  4. Deep venous thrombosis/gastrointestinal prophylaxis.  Pepcid and      subcu heparin.      Oswald Hillock, MD  Electronically Signed     BA/MEDQ  D:  01/24/2009  T:  01/24/2009  Job:  [id number]

## 2012-03-23 ENCOUNTER — Encounter: Payer: Self-pay | Admitting: Physician Assistant

## 2012-03-23 ENCOUNTER — Encounter: Payer: Self-pay | Admitting: *Deleted

## 2012-03-23 ENCOUNTER — Ambulatory Visit (INDEPENDENT_AMBULATORY_CARE_PROVIDER_SITE_OTHER): Payer: BC Managed Care – HMO | Admitting: Physician Assistant

## 2012-03-23 VITALS — BP 178/104 | HR 60 | Ht 70.0 in | Wt 197.0 lb

## 2012-03-23 DIAGNOSIS — E785 Hyperlipidemia, unspecified: Secondary | ICD-10-CM

## 2012-03-23 DIAGNOSIS — F17200 Nicotine dependence, unspecified, uncomplicated: Secondary | ICD-10-CM

## 2012-03-23 DIAGNOSIS — E669 Obesity, unspecified: Secondary | ICD-10-CM

## 2012-03-23 DIAGNOSIS — I119 Hypertensive heart disease without heart failure: Secondary | ICD-10-CM

## 2012-03-23 DIAGNOSIS — Z720 Tobacco use: Secondary | ICD-10-CM | POA: Insufficient documentation

## 2012-03-23 DIAGNOSIS — I2510 Atherosclerotic heart disease of native coronary artery without angina pectoris: Secondary | ICD-10-CM

## 2012-03-23 DIAGNOSIS — I2589 Other forms of chronic ischemic heart disease: Secondary | ICD-10-CM

## 2012-03-23 LAB — CBC WITH DIFFERENTIAL/PLATELET
Basophils Relative: 0.4 % (ref 0.0–3.0)
Eosinophils Relative: 1.8 % (ref 0.0–5.0)
HCT: 46.8 % (ref 39.0–52.0)
Hemoglobin: 14.8 g/dL (ref 13.0–17.0)
Lymphs Abs: 1.9 10*3/uL (ref 0.7–4.0)
MCV: 77.9 fl — ABNORMAL LOW (ref 78.0–100.0)
Monocytes Absolute: 0.5 10*3/uL (ref 0.1–1.0)
Monocytes Relative: 8.9 % (ref 3.0–12.0)
Neutro Abs: 2.9 10*3/uL (ref 1.4–7.7)
Platelets: 266 10*3/uL (ref 150.0–400.0)
WBC: 5.3 10*3/uL (ref 4.5–10.5)

## 2012-03-23 LAB — BASIC METABOLIC PANEL
BUN: 11 mg/dL (ref 6–23)
Chloride: 100 mEq/L (ref 96–112)
GFR: 82.97 mL/min (ref >60.00)
Potassium: 3.7 mEq/L (ref 3.5–5.1)
Sodium: 138 mEq/L (ref 135–145)

## 2012-03-23 LAB — TSH: TSH: 1.34 u[IU]/mL (ref 0.35–5.50)

## 2012-03-23 MED ORDER — AMLODIPINE BESYLATE 10 MG PO TABS: 10.0000 mg | ORAL_TABLET | Freq: Every day | ORAL | Status: DC

## 2012-03-23 MED ORDER — LISINOPRIL-HYDROCHLOROTHIAZIDE 20-12.5 MG PO TABS: 2.0000 | ORAL_TABLET | Freq: Every day | ORAL | Status: DC

## 2012-03-23 MED ORDER — SIMVASTATIN 40 MG PO TABS: 40.0000 mg | ORAL_TABLET | Freq: Every day | ORAL | Status: DC

## 2012-03-23 MED ORDER — CLONIDINE HCL 0.1 MG PO TABS
0.1000 mg | ORAL_TABLET | ORAL | Status: AC
  Administered 2012-03-23: 0.1 mg via ORAL

## 2012-03-23 NOTE — Progress Notes (Signed)
[address] [place], [place]  [address] Phone: [phone] Fax:  [phone]  Date:  03/23/2012   Name:  Frank Webb   DOB:  Jun 10, 1956   MRN:  [id number]  PCP:  No primary provider on file.  Primary Cardiologist/Primary Electrophysiologist:  Dr. Sherryl Manges    History of Present Illness: Frank Webb is a 56 y.o. male returns for follow up.  He has a history of CAD, ischemic cardiomyopathy, hypertension, hyperlipidemia, tobacco abuse and syncope.  He was evaluated 02/2010 with a stress test that indicated small inferolateral infarct with significant peri-infarct ischemia and ejection fraction of 36%.  LHC 03/06/10: Serial proximal, mid and distal LAD 40%, apical LAD 60%, dCFX serial 70%, OM2 with chronic occlusion, small oOM3 80%, pRCA 40%, mRCA 80%, dRCA 80%, PDA serial 70%, EF 35%.  PCI: Overlapping BMS x 2 (2.75 x 32 mm Veriflex and 2.75 x 16 mm Veriflex) to the mid and distal RCA.  He had improved LV function on follow up echo 07/2010: Moderate LVH, EF 45-50%, mild posterior HK, severe inferior HK, grade 1 diastolic dysfunction, mild MR, mild LAE.  Last seen by Dr. Graciela Husbands 09/2010.  Of note, the patient has a history of EP testing for unexplained syncope that was negative in the past.  He had problems with epistaxis on Effient.  He also had trouble with fatigue in the past and his beta blocker was discontinued.    Notes his BP has been high.  He quit smoking and was exercising.  BP was better and he stopped his medications.  He recently started back to smoking and quit exercising.  Restarted his Lisinopril/HCT 3 weeks ago.  He went for his [employer] physical for his job yesterday and his BP was 199/100.  He denies headaches, blurry vision, unilateral weakness.  The patient denies chest pain, shortness of breath, syncope, orthopnea, PND or significant pedal edema.  States he "feels great."  He is watching his salt intake.    Wt Readings from Last 3 Encounters:  09/23/10 199 lb  4 oz (90.379 kg)  07/07/10 190 lb (86.183 kg)  04/01/10 192 lb (87.091 kg)     Potassium  Date/Time Value Range Status  03/09/2010  5:05 AM 3.9  3.5-5.1 (mEq/L) Final     Creatinine, Ser  Date/Time Value Range Status  03/09/2010  5:05 AM 1.07  0.4-1.5 (mg/dL) Final     ALT  Date/Time Value Range Status  03/02/2010 12:51 AM 14  0-53 (U/L) Final    Past Medical History  Diagnosis Date  . Hypertension   . Hyperlipidemia   . Anxiety and depression   . Epistaxis, recurrent   . Fatigue     beta blocker d/c'd  . Syncope     negative EP study 02/2010  . Cardiomyopathy, ischemic     EF 35% in 02/2010 prior to PCI;  echo 07/2010: Moderate LVH, EF 45-50%, mild posterior HK, severe inferior HK, grade 1 diastolic dysfunction, mild MR, mild LAE  . Coronary artery disease     nuclear 2011 - small inferolateral infarct with significant peri-infarct ischemia and ejection fraction of 36%.  LHC 03/06/10: Serial proximal, mid and distal LAD 40%, apical LAD 60%, dCFX serial 70%, OM2 with chronic occlusion, small oOM3 80%, pRCA 40%, mRCA 80%, dRCA 80%, PDA serial 70%, EF 35%.  PCI: Overlapping BMS x 2 (2.75 x 32 mm Veriflex and 2.75 x 16 mm Veriflex) to RCA  . Myocardial infarction, inferior wall  post RCA stenting 03/06/2010     Current Outpatient Prescriptions  Medication Sig Dispense Refill  . aspirin 325 MG tablet Take 325 mg by mouth daily.      Marland Kitchen lisinopril-hydrochlorothiazide (PRINZIDE,ZESTORETIC) 20-12.5 MG per tablet Take 2 tablets by mouth daily.        Allergies: Allergies not on file  History  Substance Use Topics  . Smoking status: Not on file  . Smokeless tobacco: Not on file  . Alcohol Use: Not on file     ROS:  Please see the history of present illness.    All other systems reviewed and negative.   PHYSICAL EXAM: VS:  BP 178/104  Pulse 60  Ht 5\' 10"  (1.778 m)  Wt 197 lb (89.359 kg)  BMI 28.27 kg/m2 Repeat BP by me 194/112 bilaterally   Well nourished, well  developed, in no acute distress HEENT: normal Neck: no JVD Vascular: no carotid bruits bilaterally  Cardiac:  normal S1, S2; RRR; no murmur Lungs:  clear to auscultation bilaterally, no wheezing, rhonchi or rales Abd: soft, nontender, no hepatomegaly Ext: no edema Skin: warm and dry Neuro:  CNs 2-12 intact, no focal abnormalities noted Psych: normal affect   EKG:  Sinus rhythm, heart rate 60, normal axis, LVH, biphasic T waves in leads 3, aVF, V5, V6   ASSESSMENT AND PLAN:  1.  Hypertension   -  Uncontrolled.   -  Given Clonidine 0.1 mg x 1 in the office - he was observed for 20 minutes and BP came down to 160/100 prior to d/c.   -  Continue Lisinopril/HCT at current dose.   -  Add Amlodipine 10 mg daily.   -  Check BMET, CBC, TSH today.   -  Follow up with me in one week.    2.  Coronary Artery Disease   -  No angina.  Continue ASA.  Restart statin.    3.  Ischemic Cardiomyopathy   -  No signs of volume overload.  Patient could not tolerate beta blockers in past and HR 60 today.     -  Continue ACE.   -  Consider Hydralazine and Nitrates if BP remains elevated.  4.  Hyperlipidemia   -  Restart Simvastatin 40 mg QHS.   -  Check FLP and LFTs in 6-8 weeks.  5.  Obesity   -  Consider sleep testing in the future.  6.  Tobacco Abuse   -  He is trying to quit.     Signed, Tereso Newcomer, PA-C  10:18 AM 03/23/2012

## 2012-03-23 NOTE — Patient Instructions (Signed)
Your physician recommends that you schedule a follow-up appointment in: 1 WEEK WITH SCOTT WEAVER, [facility]  Your physician has recommended you make the following change in your medication: START AMLODIPINE 10 MG DAILY; SIMVASTATIN 40 MG EVERY NIGHT  Your physician recommends that you return for lab work in: TODAY BMET, CBC W/DIFF, TSH  Your physician recommends that you return for lab work in: 6-8 WEEKS FOR FASTING LIPID/LIVER PANEL 272.4

## 2012-03-24 ENCOUNTER — Telehealth: Payer: Self-pay | Admitting: *Deleted

## 2012-03-24 NOTE — Telephone Encounter (Signed)
Message copied by Tarri Fuller on Thu Mar 24, 2012 10:38 AM ------      Message from: [place], [place] T      Created: Wed Mar 23, 2012  5:38 PM       Please notify patient that the lab results are ok.      Tereso Newcomer, PA-C  5:38 PM 03/23/2012

## 2012-03-24 NOTE — Telephone Encounter (Signed)
pt notified of lab results today 

## 2012-04-01 ENCOUNTER — Ambulatory Visit (INDEPENDENT_AMBULATORY_CARE_PROVIDER_SITE_OTHER): Payer: BC Managed Care – HMO | Admitting: Physician Assistant

## 2012-04-01 ENCOUNTER — Encounter: Payer: Self-pay | Admitting: Physician Assistant

## 2012-04-01 VITALS — BP 186/102 | HR 86 | Ht 70.0 in | Wt 196.0 lb

## 2012-04-01 DIAGNOSIS — I2510 Atherosclerotic heart disease of native coronary artery without angina pectoris: Secondary | ICD-10-CM

## 2012-04-01 DIAGNOSIS — F17200 Nicotine dependence, unspecified, uncomplicated: Secondary | ICD-10-CM

## 2012-04-01 DIAGNOSIS — E785 Hyperlipidemia, unspecified: Secondary | ICD-10-CM

## 2012-04-01 DIAGNOSIS — Z720 Tobacco use: Secondary | ICD-10-CM

## 2012-04-01 DIAGNOSIS — I10 Essential (primary) hypertension: Secondary | ICD-10-CM

## 2012-04-01 MED ORDER — HYDRALAZINE HCL 25 MG PO TABS: 25.0000 mg | ORAL_TABLET | Freq: Three times a day (TID) | ORAL | Status: DC

## 2012-04-01 NOTE — Progress Notes (Signed)
[address] [place], [place]  [address] Phone: [phone] Fax:  [phone]  Date:  04/01/2012   Name:  Frank Webb   DOB:  [birth year]   MRN:  [id number]  PCP:  No primary provider on file.  Primary Cardiologist/Primary Electrophysiologist:  Dr. Sherryl Manges    History of Present Illness: Frank Webb is a 56 y.o. male returns for follow up on BP.  He has a history of CAD, ischemic cardiomyopathy, hypertension, hyperlipidemia, tobacco abuse and syncope.  He was evaluated 02/2010 with a stress test that indicated small inferolateral infarct with significant peri-infarct ischemia and ejection fraction of 36%.  LHC 03/06/10: Serial proximal, mid and distal LAD 40%, apical LAD 60%, dCFX serial 70%, OM2 with chronic occlusion, small oOM3 80%, pRCA 40%, mRCA 80%, dRCA 80%, PDA serial 70%, EF 35%.  PCI: Overlapping BMS x 2 (2.75 x 32 mm Veriflex and 2.75 x 16 mm Veriflex) to the mid and distal RCA.  He had improved LV function on follow up echo 07/2010: Moderate LVH, EF 45-50%, mild posterior HK, severe inferior HK, grade 1 diastolic dysfunction, mild MR, mild LAE.  Last seen by Dr. Graciela Husbands 09/2010.  Of note, the patient has a history of EP testing for unexplained syncope that was negative in the past.  He had problems with epistaxis on Effient.  He also had trouble with fatigue in the past and his beta blocker was discontinued.    I saw him 5/8 in follow up for his BP.  He had been off BP meds and had started smoking again.  He is on max dose ACE/thiazide.  I added amlodipine back to his medical regimen.  Statin was restarted as well.  He is taking all of his medications.  He is still trying to quit smoking.  He is frustrated about his BPs.  Otherwise, feels ok.  The patient denies chest pain, shortness of breath, syncope, orthopnea, PND or significant pedal edema.   Wt Readings from Last 3 Encounters:  04/01/12 196 lb (88.905 kg)  03/23/12 197 lb (89.359 kg)  09/23/10 199 lb  4 oz (90.379 kg)     Potassium  Date/Time Value Range Status  03/23/2012 11:39 AM 3.7  3.5-5.1 (mEq/L) Final     Creatinine, Ser  Date/Time Value Range Status  03/23/2012 11:39 AM 1.2  0.4-1.5 (mg/dL) Final   Hemoglobin  Date/Time Value Range Status  03/23/2012 11:39 AM 14.8  13.0-17.0 (g/dL) Final     TSH  Date/Time Value Range Status  03/23/2012 11:39 AM 1.34  0.35-5.50 (uIU/mL) Final    Past Medical History  Diagnosis Date  . Hypertension   . Hyperlipidemia   . Anxiety and depression   . Epistaxis, recurrent   . Fatigue     beta blocker d/c'd  . Syncope     negative EP study 02/2010  . Cardiomyopathy, ischemic     EF 35% in 02/2010 prior to PCI;  echo 07/2010: Moderate LVH, EF 45-50%, mild posterior HK, severe inferior HK, grade 1 diastolic dysfunction, mild MR, mild LAE  . Coronary artery disease     nuclear 2011 - small inferolateral infarct with significant peri-infarct ischemia and ejection fraction of 36%.  LHC 03/06/10: Serial proximal, mid and distal LAD 40%, apical LAD 60%, dCFX serial 70%, OM2 with chronic occlusion, small oOM3 80%, pRCA 40%, mRCA 80%, dRCA 80%, PDA serial 70%, EF 35%.  PCI: Overlapping BMS x 2 (2.75 x 32 mm Veriflex and 2.75 x 16  mm Veriflex) to RCA  . Myocardial infarction, inferior wall     post RCA stenting 03/06/2010     Current Outpatient Prescriptions  Medication Sig Dispense Refill  . amLODipine (NORVASC) 10 MG tablet Take 1 tablet (10 mg total) by mouth daily.  30 tablet  11  . aspirin 325 MG tablet Take 325 mg by mouth daily.      Marland Kitchen lisinopril-hydrochlorothiazide (PRINZIDE,ZESTORETIC) 20-12.5 MG per tablet Take 2 tablets by mouth daily.  60 tablet  11  . simvastatin (ZOCOR) 40 MG tablet Take 1 tablet (40 mg total) by mouth at bedtime.  30 tablet  11    Allergies: No Known Allergies  History  Substance Use Topics  . Smoking status: Current Everyday Smoker -- 0.8 packs/day for 30 years    Types: Cigarettes  . Smokeless tobacco: Never  Used  . Alcohol Use: Yes     Occ     ROS:  Please see the history of present illness.   All other systems reviewed and negative.   PHYSICAL EXAM: VS:  BP 186/102  Pulse 86  Ht 5\' 10"  (1.778 m)  Wt 196 lb (88.905 kg)  BMI 28.12 kg/m2 Repeat BP by me 150/100 on right  Well nourished, well developed, in no acute distress HEENT: normal Neck: no JVD Vascular: no carotid bruits bilaterally; no abdominal bruits   Cardiac:  normal S1, S2; RRR; no murmur Lungs:  clear to auscultation bilaterally, no wheezing, rhonchi or rales Abd: soft, nontender, no hepatomegaly Ext: no edema Skin: warm and dry Neuro:  CNs 2-12 intact, no focal abnormalities noted Psych: normal affect    ASSESSMENT AND PLAN:  1.  Hypertension   -  Uncontrolled, but improved.   -  Add Hydralazine 25 mg TID.   -  Check renal arterial dopplers.   -  D/c cigs     2.  Coronary Artery Disease   -  No angina.  Continue ASA and statin.    3.  Ischemic Cardiomyopathy   -  No signs of volume overload.  Patient could not tolerate beta blockers in past and HR 60 today.     -  Continue ACE and add Hydralazine    4.  Hyperlipidemia   -  Continue Simvastatin 40 mg QHS.   -  FLP and LFTs set for next month   5.  Obesity   -  Consider sleep testing in the future.  6.  Tobacco Abuse   -  He is trying to quit.   -  We discussed the importance of cessation and different strategies for quitting.   He is not interested in Chantix.     Luna Glasgow, PA-C  12:26 PM 04/01/2012

## 2012-04-01 NOTE — Patient Instructions (Signed)
START HYDRALAZINE 25 MG 1 TABLET 3 TIMES DAILY A NEW PRESCRIPTION WAS SENT IN TODAY FOR YOU  Your physician has requested that you have a BILATERAL renal artery duplex DX CAD, HTN. During this test, an ultrasound is used to evaluate blood flow to the kidneys. Allow one hour for this exam. Do not eat after midnight the day before and avoid carbonated beverages. Take your medications as you usually do.   PLEASE MAKE APPOINTMENT TO SEE SCOTT WEAVER, [facility] EITHER NEXT Thursday 5/23 OR 5/28 PER SCOTT WEAVER, PAC  PLEASE MAKE APPOINTMENT TO SEE DR. KLEIN IN 3 MONTHS    2 Gram Low Sodium Diet A 2 gram sodium diet restricts the amount of sodium in the diet to no more than 2 g or 2000 mg daily. Limiting the amount of sodium is often used to help lower blood pressure. It is important if you have heart, liver, or kidney problems. Many foods contain sodium for flavor and sometimes as a preservative. When the amount of sodium in a diet needs to be low, it is important to know what to look for when choosing foods and drinks. The following includes some information and guidelines to help make it easier for you to adapt to a low sodium diet. QUICK TIPS  Do not add salt to food.   Avoid convenience items and fast food.   Choose unsalted snack foods.   Buy lower sodium products, often labeled as "lower sodium" or "no salt added."   Check food labels to learn how much sodium is in 1 serving.   When eating at a restaurant, ask that your food be prepared with less salt or none, if possible.  READING FOOD LABELS FOR SODIUM INFORMATION The nutrition facts label is a good place to find how much sodium is in foods. Look for products with no more than 500 to 600 mg of sodium per meal and no more than 150 mg per serving. Remember that 2 g = 2000 mg. The food label may also list foods as:  Sodium-free: Less than 5 mg in a serving.   Very low sodium: 35 mg or less in a serving.   Low-sodium: 140 mg or less in a  serving.   Light in sodium: 50% less sodium in a serving. For example, if a food that usually has 300 mg of sodium is changed to become light in sodium, it will have 150 mg of sodium.   Reduced sodium: 25% less sodium in a serving. For example, if a food that usually has 400 mg of sodium is changed to reduced sodium, it will have 300 mg of sodium.  CHOOSING FOODS Grains  Avoid: Salted crackers and snack items. Some cereals, including instant hot cereals. Bread stuffing and biscuit mixes. Seasoned rice or pasta mixes.   Choose: Unsalted snack items. Low-sodium cereals, oats, puffed wheat and rice, shredded wheat. English muffins and bread. Pasta.  Meats  Avoid: Salted, canned, smoked, spiced, pickled meats, including fish and poultry. Bacon, ham, sausage, cold cuts, hot dogs, anchovies.   Choose: Low-sodium canned tuna and salmon. Fresh or frozen meat, poultry, and fish.  Dairy  Avoid: Processed cheese and spreads. Cottage cheese. Buttermilk and condensed milk. Regular cheese.   Choose: Milk. Low-sodium cottage cheese. Yogurt. Sour cream. Low-sodium cheese.  Fruits and Vegetables  Avoid: Regular canned vegetables. Regular canned tomato sauce and paste. Frozen vegetables in sauces. Olives. Rosita Fire. Relishes. Sauerkraut.   Choose: Low-sodium canned vegetables. Low-sodium tomato sauce and paste. Frozen  or fresh vegetables. Fresh and frozen fruit.  Condiments  Avoid: Canned and packaged gravies. Worcestershire sauce. Tartar sauce. Barbecue sauce. Soy sauce. Steak sauce. Ketchup. Onion, garlic, and table salt. Meat flavorings and tenderizers.   Choose: Fresh and dried herbs and spices. Low-sodium varieties of mustard and ketchup. Lemon juice. Tabasco sauce. Horseradish.  SAMPLE 2 GRAM SODIUM MEAL PLAN Breakfast / Sodium (mg)  1 cup low-fat milk / 143 mg   2 slices whole-wheat toast / 270 mg   1 tbs heart-healthy margarine / 153 mg   1 hard-boiled egg / 139 mg   1 small orange / 0  mg  Lunch / Sodium (mg)  1 cup raw carrots / 76 mg    cup hummus / 298 mg   1 cup low-fat milk / 143 mg    cup red grapes / 2 mg   1 whole-wheat pita bread / 356 mg  Dinner / Sodium (mg)  1 cup whole-wheat pasta / 2 mg   1 cup low-sodium tomato sauce / 73 mg   3 oz lean ground beef / 57 mg   1 small side salad (1 cup raw spinach leaves,  cup cucumber,  cup yellow bell pepper) with 1 tsp olive oil and 1 tsp red wine vinegar / 25 mg  Snack / Sodium (mg)  1 container low-fat vanilla yogurt / 107 mg   3 graham cracker squares / 127 mg  Nutrient Analysis  Calories: 2033   Protein: 77 g   Carbohydrate: 282 g   Fat: 72 g   Sodium: 1971 mg  Document Released: 11/02/2005 Document Revised: 10/22/2011 Document Reviewed: 02/03/2010 [facility] Patient Information 2012 [place], [place].

## 2012-04-07 ENCOUNTER — Ambulatory Visit (INDEPENDENT_AMBULATORY_CARE_PROVIDER_SITE_OTHER): Payer: BC Managed Care – HMO | Admitting: Physician Assistant

## 2012-04-07 ENCOUNTER — Encounter: Payer: Self-pay | Admitting: Physician Assistant

## 2012-04-07 VITALS — BP 130/90 | HR 91 | Ht 70.0 in | Wt 196.0 lb

## 2012-04-07 DIAGNOSIS — I2510 Atherosclerotic heart disease of native coronary artery without angina pectoris: Secondary | ICD-10-CM

## 2012-04-07 DIAGNOSIS — I10 Essential (primary) hypertension: Secondary | ICD-10-CM

## 2012-04-07 DIAGNOSIS — R9431 Abnormal electrocardiogram [ECG] [EKG]: Secondary | ICD-10-CM

## 2012-04-07 NOTE — Progress Notes (Signed)
[address] [place], [place]  [address] Phone: [phone] Fax:  [phone]  Date:  04/07/2012   Name:  Frank Webb   DOB:  05/20/1956   MRN:  [id number]  PCP:  No primary provider on file.  Primary Cardiologist/Primary Electrophysiologist:  Dr. Sherryl Manges    History of Present Illness: Frank Webb is a 56 y.o. male returns for follow up on BP.  He has a history of CAD, ischemic cardiomyopathy, hypertension, hyperlipidemia, tobacco abuse and syncope.  He was evaluated 02/2010 with a stress test that indicated small inferolateral infarct with significant peri-infarct ischemia and ejection fraction of 36%.  LHC 03/06/10: Serial proximal, mid and distal LAD 40%, apical LAD 60%, dCFX serial 70%, OM2 with chronic occlusion, small oOM3 80%, pRCA 40%, mRCA 80%, dRCA 80%, PDA serial 70%, EF 35%.  PCI: Overlapping BMS x 2 (2.75 x 32 mm Veriflex and 2.75 x 16 mm Veriflex) to the mid and distal RCA.  He had improved LV function on follow up echo 07/2010: Moderate LVH, EF 45-50%, mild posterior HK, severe inferior HK, grade 1 diastolic dysfunction, mild MR, mild LAE.  Last seen by Dr. Graciela Husbands 09/2010.  Of note, the patient has a history of EP testing for unexplained syncope that was negative in the past.  He had problems with epistaxis on Effient.  He also had trouble with fatigue in the past and his beta blocker was discontinued.    I have seen him recently for evaluation of his blood pressure.  He had been off of his medications.  We have been adjusting his medicines and I recently put him on hydralazine 25 mg 3 times a day.  Renal arterial Dopplers are pending.  He has no complaints.  Denies chest pain, shortness of breath, syncope, orthopnea, PND or edema.  He is down to just a few cigarettes a day now.  Wt Readings from Last 3 Encounters:  04/07/12 196 lb (88.905 kg)  04/01/12 196 lb (88.905 kg)  03/23/12 197 lb (89.359 kg)    Potassium  Date/Time Value Range Status    03/23/2012 11:39 AM 3.7  3.5-5.1 (mEq/L) Final     Creatinine, Ser  Date/Time Value Range Status  03/23/2012 11:39 AM 1.2  0.4-1.5 (mg/dL) Final   Hemoglobin  Date/Time Value Range Status  03/23/2012 11:39 AM 14.8  13.0-17.0 (g/dL) Final     TSH  Date/Time Value Range Status  03/23/2012 11:39 AM 1.34  0.35-5.50 (uIU/mL) Final    Past Medical History  Diagnosis Date  . Hypertension   . Hyperlipidemia   . Anxiety and depression   . Epistaxis, recurrent   . Fatigue     beta blocker d/c'd  . Syncope     negative EP study 02/2010  . Cardiomyopathy, ischemic     EF 35% in 02/2010 prior to PCI;  echo 07/2010: Moderate LVH, EF 45-50%, mild posterior HK, severe inferior HK, grade 1 diastolic dysfunction, mild MR, mild LAE  . Coronary artery disease     nuclear 2011 - small inferolateral infarct with significant peri-infarct ischemia and ejection fraction of 36%.  LHC 03/06/10: Serial proximal, mid and distal LAD 40%, apical LAD 60%, dCFX serial 70%, OM2 with chronic occlusion, small oOM3 80%, pRCA 40%, mRCA 80%, dRCA 80%, PDA serial 70%, EF 35%.  PCI: Overlapping BMS x 2 (2.75 x 32 mm Veriflex and 2.75 x 16 mm Veriflex) to RCA  . Myocardial infarction, inferior wall     post RCA  stenting 03/06/2010     Current Outpatient Prescriptions  Medication Sig Dispense Refill  . amLODipine (NORVASC) 10 MG tablet Take 1 tablet (10 mg total) by mouth daily.  30 tablet  11  . aspirin 325 MG tablet Take 325 mg by mouth daily.      . hydrALAZINE (APRESOLINE) 25 MG tablet Take 1 tablet (25 mg total) by mouth 3 (three) times daily.  270 tablet  3  . lisinopril-hydrochlorothiazide (PRINZIDE,ZESTORETIC) 20-12.5 MG per tablet Take 2 tablets by mouth daily.  60 tablet  11  . simvastatin (ZOCOR) 40 MG tablet Take 1 tablet (40 mg total) by mouth at bedtime.  30 tablet  11    Allergies: No Known Allergies  History  Substance Use Topics  . Smoking status: Current Everyday Smoker -- 0.8 packs/day for 30 years     Types: Cigarettes  . Smokeless tobacco: Never Used  . Alcohol Use: Yes     Occ     PHYSICAL EXAM: VS:  BP 130/90  Pulse 91  Ht 5\' 10"  (1.778 m)  Wt 196 lb (88.905 kg)  BMI 28.12 kg/m2 Repeat BP by me 126/90 on right  Well nourished, well developed, in no acute distress HEENT: normal Neck: no JVD Cardiac:  normal S1, S2; RRR; no murmur Lungs:  clear to auscultation bilaterally, no wheezing, rhonchi or rales Abd: soft, nontender, no hepatomegaly Ext: no edema Skin: warm and dry Neuro:  CNs 2-12 intact, no focal abnormalities noted Psych: normal affect    ASSESSMENT AND PLAN:  1.  Hypertension Improved Continue current medications. Continue to work on d/c cigs and increasing activity and watching salt.    2.  Coronary Artery Disease No angina.  Continue ASA and statin.   I reviewed his hx.  His ECG demonstrates LVH with prob repol abnormality, but he does have prominent TW inversions inferiorly He had no CP prior to his PCI in 2011.  He just had syncope. In light of his ECG and uncontrolled BP and continued smoking, I think it best to follow up with a myoview. Schedule ETT-Myoview. Follow up with Dr. Sherryl Manges as planned or sooner if myoview abnormal.   3.  Ischemic Cardiomyopathy No signs of volume overload.  Patient could not tolerate beta blockers in past and HR 60 today.   Continue ACE and Hydralazine    4.  Hyperlipidemia Continue Simvastatin 40 mg QHS. FLP and LFTs set for next month   5.  Tobacco Abuse He is trying to quit.   Signed, Tereso Newcomer, PA-C  2:41 PM 04/07/2012

## 2012-04-07 NOTE — Patient Instructions (Signed)
Your physician recommends that keep your follow-up appointment as scheduled with Dr Graciela Husbands Your physician has requested that you have en exercise stress myoview. For further information please visit [email or web address] Please follow instruction sheet, as given.

## 2012-04-14 ENCOUNTER — Encounter (INDEPENDENT_AMBULATORY_CARE_PROVIDER_SITE_OTHER): Payer: BC Managed Care – HMO

## 2012-04-14 DIAGNOSIS — I701 Atherosclerosis of renal artery: Secondary | ICD-10-CM

## 2012-04-14 DIAGNOSIS — I2510 Atherosclerotic heart disease of native coronary artery without angina pectoris: Secondary | ICD-10-CM

## 2012-04-14 DIAGNOSIS — I10 Essential (primary) hypertension: Secondary | ICD-10-CM

## 2012-04-27 ENCOUNTER — Telehealth: Payer: Self-pay | Admitting: *Deleted

## 2012-04-27 NOTE — Telephone Encounter (Signed)
pt notified of renal dopplers, normal

## 2012-05-02 ENCOUNTER — Encounter (HOSPITAL_COMMUNITY): Payer: BC Managed Care – HMO

## 2012-05-02 ENCOUNTER — Other Ambulatory Visit: Payer: BC Managed Care – HMO

## 2012-05-04 ENCOUNTER — Other Ambulatory Visit: Payer: BC Managed Care – HMO

## 2012-07-15 ENCOUNTER — Ambulatory Visit: Payer: BC Managed Care – HMO | Admitting: Internal Medicine

## 2012-08-31 ENCOUNTER — Ambulatory Visit: Payer: BC Managed Care – HMO | Admitting: Internal Medicine

## 2012-12-10 ENCOUNTER — Emergency Department (HOSPITAL_COMMUNITY)
Admission: EM | Admit: 2012-12-10 | Discharge: 2012-12-10 | Disposition: A | Discharge status: Home / Self Care | Payer: Self-pay | Source: Home / Self Care

## 2012-12-10 NOTE — ED Notes (Signed)
DOT (split) UDS collection completed.

## 2013-04-13 ENCOUNTER — Encounter: Payer: Self-pay | Admitting: Internal Medicine

## 2013-04-13 ENCOUNTER — Ambulatory Visit (INDEPENDENT_AMBULATORY_CARE_PROVIDER_SITE_OTHER): Payer: BC Managed Care – HMO | Admitting: Internal Medicine

## 2013-04-13 VITALS — BP 200/130 | HR 71 | Ht 69.0 in | Wt 192.0 lb

## 2013-04-13 DIAGNOSIS — F341 Dysthymic disorder: Secondary | ICD-10-CM

## 2013-04-13 DIAGNOSIS — I119 Hypertensive heart disease without heart failure: Secondary | ICD-10-CM

## 2013-04-13 DIAGNOSIS — Z720 Tobacco use: Secondary | ICD-10-CM

## 2013-04-13 DIAGNOSIS — R5383 Other fatigue: Secondary | ICD-10-CM

## 2013-04-13 DIAGNOSIS — F17200 Nicotine dependence, unspecified, uncomplicated: Secondary | ICD-10-CM

## 2013-04-13 DIAGNOSIS — I2510 Atherosclerotic heart disease of native coronary artery without angina pectoris: Secondary | ICD-10-CM

## 2013-04-13 DIAGNOSIS — I10 Essential (primary) hypertension: Secondary | ICD-10-CM

## 2013-04-13 MED ORDER — AMLODIPINE BESYLATE 10 MG PO TABS: 10.0000 mg | ORAL_TABLET | Freq: Every day | ORAL | Status: DC

## 2013-04-13 MED ORDER — HYDRALAZINE HCL 50 MG PO TABS: 50.0000 mg | ORAL_TABLET | Freq: Three times a day (TID) | ORAL | Status: DC

## 2013-04-13 MED ORDER — LISINOPRIL-HYDROCHLOROTHIAZIDE 20-12.5 MG PO TABS: 2.0000 | ORAL_TABLET | Freq: Every day | ORAL | Status: DC

## 2013-04-13 NOTE — Patient Instructions (Addendum)
Your physician has recommended you make the following change in your medication:  1) Decrease aspirin to 81 mg one tablet by mouth daily. 2) Increase hydralazine to 50 mg one tablet by mouth three times a day. 3) Start amlodipine 10 mg one tablet by mouth daily  Your physician has requested that you have a renal artery duplex. During this test, an ultrasound is used to evaluate blood flow to the kidneys. Allow one hour for this exam. Do not eat after midnight the day before and avoid carbonated beverages. Take your medications as you usually do.  Your physician recommends that you schedule a follow-up appointment in: 2 weeks with Tereso Newcomer, PA

## 2013-04-13 NOTE — Assessment & Plan Note (Signed)
Still smoking

## 2013-04-13 NOTE — Progress Notes (Signed)
Patient has no care team.   HPI  Frank Webb is a 57 y.o. male   seen in followup for ischemic heart disease following revascularization in May following LHC 03/06/10: Serial proximal, mid and distal LAD 40%, apical LAD 60%, dCFX serial 70%, OM2 with chronic occlusion, small oOM3 80%, pRCA 40%, mRCA 80%, dRCA 80%, PDA serial 70%, EF 35%. PCI: Overlapping BMS x 2 (2.75 x 32 mm Veriflex and 2.75 x 16 mm Veriflex) to the mid and distal RCA. He had improved LV function on follow up echo 07/2010: Moderate LVH, EF 45-50%, mild posterior HK, severe inferior HK, grade 1 diastolic dysfunction, mild MR, mild LAE  I have not seen ihim in more than 2 years;  He was seen by Tereso Newcomer most recently a year ago because of blood pressure issues.  He has been noncompliant. 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   Past Medical History  Diagnosis Date  . Hypertension   . Hyperlipidemia   . Anxiety and depression   . Epistaxis, recurrent   . Fatigue     beta blocker d/c'd  . Syncope     negative EP study 02/2010  . Cardiomyopathy, ischemic     EF 35% in 02/2010 prior to PCI;  echo 07/2010: Moderate LVH, EF 45-50%, mild posterior HK, severe inferior HK, grade 1 diastolic dysfunction, mild MR, mild LAE  . Coronary artery disease     nuclear 2011 - small inferolateral infarct with significant peri-infarct ischemia and ejection fraction of 36%.  LHC 03/06/10: Serial proximal, mid and distal LAD 40%, apical LAD 60%, dCFX serial 70%, OM2 with chronic occlusion, small oOM3 80%, pRCA 40%, mRCA 80%, dRCA 80%, PDA serial 70%, EF 35%.  PCI: Overlapping BMS x 2 (2.75 x 32 mm Veriflex and 2.75 x 16 mm Veriflex) to RCA  . Myocardial infarction, inferior wall     post RCA stenting 03/06/2010     Past Surgical History  Procedure Laterality Date  . Cardiac catheterization  03/06/2010   . Coronary angioplasty with stent placement  03/06/2010     drug-eluting stents in the  RCA  . Cardiac catheterization  03/10/2010    This study demonstrates no inducible VT or inducible SVT in a patient with ischemic heart disease, mild LV dysfunction (normal echo, 35% by cath with prior stenting to the RCA)  --      Current Outpatient Prescriptions  Medication Sig Dispense Refill  . aspirin 325 MG tablet Take 325 mg by mouth daily.      . hydrALAZINE (APRESOLINE) 25 MG tablet Take 1 tablet (25 mg total) by mouth 3 (three) times daily.  270 tablet  3  . lisinopril-hydrochlorothiazide (PRINZIDE,ZESTORETIC) 20-12.5 MG per tablet Take 2 tablets by mouth daily.  60 tablet  11  . simvastatin (ZOCOR) 40 MG tablet Take 1 tablet (40 mg total) by mouth at bedtime.  30 tablet  11  . amLODipine (NORVASC) 10 MG tablet Take 1 tablet (10 mg total) by mouth daily.  30 tablet  11   No current facility-administered medications for this visit.    No Known Allergies  Review of Systems negative except from HPI and PMH  Physical Exam BP 200/130  Pulse 71  Ht 5\' 9"  (1.753 m)  Wt 192 lb (87.091 kg)  BMI 28.34 kg/m2 Well developed and nourished in no acute distress HENT normal Neck supple with JVP-flat Clear Regular rate and rhythm, no  murmurs or gallops Abd-soft with active BS No Clubbing cyanosis edema Skin-warm and dry A & Oriented  Grossly normal sensory and motor function  eccardiogram demonstrated sinus rhythm at 71 Intervals 16/10/38 LVH with recalls   Assessment and  Plan

## 2013-04-13 NOTE — Assessment & Plan Note (Signed)
Status post stenting. He needs to the back on aspirin 81 mg

## 2013-04-13 NOTE — Assessment & Plan Note (Signed)
The patient has poorly controlled hypertension largely related to noncompliance. He has been on medication now for a couple of weeks. We will continue him on his lisinopril. We'll increase his hydralazine not withstanding the problem of 3 times a day and compliance. We'll add back amlodipine. I consider clonidine but his termination medications next rebound of the detail. His lack of compliance also makes use of Aldactone and the possibility of hyperkalemia treacherous.  We'll undertake renal vascular Doppler study shows of else going on. I suggested that he consider seeing Dr. Rowe Robert for blood pressure management and also to establish a primary care.

## 2013-04-13 NOTE — Assessment & Plan Note (Signed)
Apparently has a history of sleep apnea but is not using CPAP.

## 2013-04-27 ENCOUNTER — Encounter (INDEPENDENT_AMBULATORY_CARE_PROVIDER_SITE_OTHER): Payer: BC Managed Care – HMO

## 2013-04-27 DIAGNOSIS — I119 Hypertensive heart disease without heart failure: Secondary | ICD-10-CM

## 2013-04-27 DIAGNOSIS — I70219 Atherosclerosis of native arteries of extremities with intermittent claudication, unspecified extremity: Secondary | ICD-10-CM

## 2013-04-27 DIAGNOSIS — I10 Essential (primary) hypertension: Secondary | ICD-10-CM

## 2013-05-04 ENCOUNTER — Encounter: Payer: Self-pay | Admitting: Physician Assistant

## 2013-05-04 ENCOUNTER — Ambulatory Visit (INDEPENDENT_AMBULATORY_CARE_PROVIDER_SITE_OTHER): Payer: BC Managed Care – HMO | Admitting: Physician Assistant

## 2013-05-04 VITALS — BP 149/100 | HR 86 | Ht 69.0 in | Wt 193.8 lb

## 2013-05-04 DIAGNOSIS — I2510 Atherosclerotic heart disease of native coronary artery without angina pectoris: Secondary | ICD-10-CM

## 2013-05-04 DIAGNOSIS — E785 Hyperlipidemia, unspecified: Secondary | ICD-10-CM

## 2013-05-04 DIAGNOSIS — I10 Essential (primary) hypertension: Secondary | ICD-10-CM

## 2013-05-04 DIAGNOSIS — I119 Hypertensive heart disease without heart failure: Secondary | ICD-10-CM

## 2013-05-04 DIAGNOSIS — Z720 Tobacco use: Secondary | ICD-10-CM

## 2013-05-04 DIAGNOSIS — I2589 Other forms of chronic ischemic heart disease: Secondary | ICD-10-CM

## 2013-05-04 DIAGNOSIS — F17200 Nicotine dependence, unspecified, uncomplicated: Secondary | ICD-10-CM

## 2013-05-04 MED ORDER — HYDRALAZINE HCL 50 MG PO TABS: 75.0000 mg | ORAL_TABLET | Freq: Three times a day (TID) | ORAL | Status: DC

## 2013-05-04 NOTE — Patient Instructions (Addendum)
NURSE VISIT IN THE NEXT 7-10 DAYS; HYDRALAZINE INCREASED TO 75 MG 3 X DAILY ON 05/04/13 PER SCOTT WEAVER, PAC   PLEASE FOLLOW UP WITH SCOTT WEAVER, PAC  IN 1 MONTH  INCREASE HYDRALAZINE TO 75 MG THREE TIMES DAILY; NEW RX SENT IN TODAY

## 2013-05-04 NOTE — Progress Notes (Signed)
[address], [address] [place], [place]  [address] Phone: [phone] Fax:  [phone]  Date:  05/04/2013   ID:  Frank Webb, DOB 1956-01-21, MRN [id number]  PCP:  Frank Pitter, MD  Cardiologist/Electrophysiologist:  Dr. Sherryl Webb    History of Present Illness: Frank Webb is a 57 y.o. male who returns for f/u.  He has a hx of CAD, ischemic cardiomyopathy, HTN, HL, tobacco abuse and syncope. He was evaluated 02/2010 with a stress test that indicated small inferolateral infarct with significant peri-infarct ischemia and ejection fraction of 36%. LHC 03/06/10: Serial proximal, mid and distal LAD 40%, apical LAD 60%, dCFX serial 70%, OM2 with chronic occlusion, small oOM3 80%, pRCA 40%, mRCA 80%, dRCA 80%, PDA serial 70%, EF 35%. PCI: Overlapping BMS x 2 (2.75 x 32 mm Veriflex and 2.75 x 16 mm Veriflex) to the mid and distal RCA. He had improved LV function on follow up echo 07/2010: Moderate LVH, EF 45-50%, mild posterior HK, severe inferior HK, grade 1 diastolic dysfunction, mild MR, mild LAE. Of note, the patient has a history of EP testing for unexplained syncope that was negative in the past. He had problems with epistaxis on Effient. He also had trouble with fatigue in the past and his beta blocker was discontinued.  Renal U/S 5/13:  No RAS.  He recently saw Dr. Graciela Webb for followup of his blood pressure was markedly elevated. Medications were adjusted and he was set up for followup today. 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  He reports compliance with medications.  The patient denies chest pain, shortness of breath, syncope, orthopnea, PND or significant pedal edema.   Labs (5/13):  K 3.7, Cr 1.2, Hgb 14.8, TSH 1.34  Wt Readings from Last 3 Encounters:  05/04/13 193 lb 12.8 oz (87.907 kg)  04/13/13 192 lb (87.091 kg)  04/07/12 196 lb (88.905 kg)     Past Medical History  Diagnosis Date  . Hypertension   . Hyperlipidemia   .  Anxiety and depression   . Epistaxis, recurrent   . Fatigue     beta blocker d/c'd  . Syncope     negative EP study 02/2010  . Cardiomyopathy, ischemic     EF 35% in 02/2010 prior to PCI;  echo 07/2010: Moderate LVH, EF 45-50%, mild posterior HK, severe inferior HK, grade 1 diastolic dysfunction, mild MR, mild LAE  . Coronary artery disease     nuclear 2011 - small inferolateral infarct with significant peri-infarct ischemia and ejection fraction of 36%.  LHC 03/06/10: Serial proximal, mid and distal LAD 40%, apical LAD 60%, dCFX serial 70%, OM2 with chronic occlusion, small oOM3 80%, pRCA 40%, mRCA 80%, dRCA 80%, PDA serial 70%, EF 35%.  PCI: Overlapping BMS x 2 (2.75 x 32 mm Veriflex and 2.75 x 16 mm Veriflex) to RCA  . Myocardial infarction, inferior wall     post RCA stenting 03/06/2010     Current Outpatient Prescriptions  Medication Sig Dispense Refill  . amLODipine (NORVASC) 10 MG tablet Take 1 tablet (10 mg total) by mouth daily.  90 tablet  3  . aspirin 81 MG tablet Take 1 tablet (81 mg total) by mouth daily.      . hydrALAZINE (APRESOLINE) 50 MG tablet Take 1 tablet (50 mg total) by mouth 3 (three) times daily.  270 tablet  3  . lisinopril-hydrochlorothiazide (PRINZIDE,ZESTORETIC) 20-12.5 MG per tablet Take 2 tablets by mouth daily.  180  tablet  3  . simvastatin (ZOCOR) 40 MG tablet Take 1 tablet (40 mg total) by mouth at bedtime.  30 tablet  11   No current facility-administered medications for this visit.    Allergies:   No Known Allergies  Social History:  The patient  reports that he has been smoking Cigarettes.  He has a 24 pack-year smoking history. He has never used smokeless tobacco. He reports that  drinks alcohol. He reports that he does not use illicit drugs.   ROS:  Please see the history of present illness.      All other systems reviewed and negative.   PHYSICAL EXAM: VS:  BP 149/100  Pulse 86  Ht 5\' 9"  (1.753 m)  Wt 193 lb 12.8 oz (87.907 kg)  BMI 28.61  kg/m2 Well nourished, well developed, in no acute distress HEENT: normal Neck: no JVD Carotids: No bruits Cardiac:  normal S1, S2; RRR; no murmur Lungs:  clear to auscultation bilaterally, no wheezing, rhonchi or rales Abd: soft, nontender, no hepatomegaly Ext: no edema Skin: warm and dry Neuro:  CNs 2-12 intact, no focal abnormalities noted  EKG:  NSR, HR 86, LVH with repolarization abnormality, no change from prior tracing     ASSESSMENT AND PLAN:  1. Hypertension: Somewhat better controlled. I will adjust his hydralazine to 75 mg 3 times a day. 2. Ischemic Cardiomyopathy: He has been unable to tolerate beta blockers in the past. He is on a good dose of ACE inhibitor and is on hydralazine. He was recently given a prescription for Viagra. Therefore, I will not start nitrates. We could consider the addition of spironolactone for control his blood pressure as well as to advance his medical therapy. I will have to ensure that his compliance will allow [place] to use this medication that requires frequent blood draws. 3. CAD: No angina. Continue aspirin and statin. 4. Hyperlipidemia: Continue statin. 5. Tobacco Abuse:  We discussed the importance of quitting. 6. Disposition: Follow up blood pressure check with the nurse in 7-10 days. Follow up with me in one month to  Signed, Frank Newcomer, PA-C  05/04/2013 12:07 PM

## 2013-05-11 ENCOUNTER — Encounter: Payer: Self-pay | Admitting: *Deleted

## 2013-05-11 ENCOUNTER — Ambulatory Visit (INDEPENDENT_AMBULATORY_CARE_PROVIDER_SITE_OTHER): Payer: BC Managed Care – HMO | Admitting: *Deleted

## 2013-05-11 VITALS — BP 120/86 | HR 68 | Ht 69.0 in | Wt 191.4 lb

## 2013-05-11 DIAGNOSIS — I10 Essential (primary) hypertension: Secondary | ICD-10-CM

## 2013-05-11 NOTE — Progress Notes (Signed)
PT CAME IN FOR B/P CHECK    SEE VITAL SIGN FLOW SHEET  ALSO PT STATES QUIT SMOKING  4 DAYS AGO . WHILE  SPEAKING ALSO STATES NEEDING   CLEARANCE  LETTER  TO DRIVE TRUCK SEE LETTERS TAB .Frank Webb

## 2013-06-05 ENCOUNTER — Ambulatory Visit: Payer: Self-pay | Admitting: Physician Assistant

## 2013-06-16 ENCOUNTER — Telehealth: Payer: Self-pay | Admitting: Physician Assistant

## 2013-06-16 NOTE — Telephone Encounter (Signed)
Received patient's completed FMLA forms from [employer]Scott W on 8.1.2014. Called the patient for pickup/djc Patient arrived at [facility] for pick up/djc

## 2013-10-26 ENCOUNTER — Encounter (HOSPITAL_COMMUNITY): Payer: Self-pay | Admitting: Emergency Medicine

## 2013-10-26 ENCOUNTER — Emergency Department (HOSPITAL_COMMUNITY)
Admission: EM | Admit: 2013-10-26 | Discharge: 2013-10-26 | Disposition: A | Discharge status: Home / Self Care | Payer: BC Managed Care – PPO | Attending: Emergency Medicine | Admitting: Emergency Medicine

## 2013-10-26 DIAGNOSIS — T7491XA Unspecified adult maltreatment, confirmed, initial encounter: Secondary | ICD-10-CM | POA: Insufficient documentation

## 2013-10-26 DIAGNOSIS — S0100XA Unspecified open wound of scalp, initial encounter: Secondary | ICD-10-CM | POA: Insufficient documentation

## 2013-10-26 DIAGNOSIS — Z9861 Coronary angioplasty status: Secondary | ICD-10-CM | POA: Insufficient documentation

## 2013-10-26 DIAGNOSIS — I2510 Atherosclerotic heart disease of native coronary artery without angina pectoris: Secondary | ICD-10-CM | POA: Insufficient documentation

## 2013-10-26 DIAGNOSIS — Z7982 Long term (current) use of aspirin: Secondary | ICD-10-CM | POA: Insufficient documentation

## 2013-10-26 DIAGNOSIS — T7492XA Unspecified child maltreatment, confirmed, initial encounter: Secondary | ICD-10-CM | POA: Insufficient documentation

## 2013-10-26 DIAGNOSIS — Z79899 Other long term (current) drug therapy: Secondary | ICD-10-CM | POA: Insufficient documentation

## 2013-10-26 DIAGNOSIS — Z8659 Personal history of other mental and behavioral disorders: Secondary | ICD-10-CM | POA: Insufficient documentation

## 2013-10-26 DIAGNOSIS — S0101XA Laceration without foreign body of scalp, initial encounter: Secondary | ICD-10-CM

## 2013-10-26 DIAGNOSIS — Z87891 Personal history of nicotine dependence: Secondary | ICD-10-CM | POA: Insufficient documentation

## 2013-10-26 DIAGNOSIS — IMO0002 Reserved for concepts with insufficient information to code with codable children: Secondary | ICD-10-CM | POA: Insufficient documentation

## 2013-10-26 DIAGNOSIS — I10 Essential (primary) hypertension: Secondary | ICD-10-CM | POA: Insufficient documentation

## 2013-10-26 DIAGNOSIS — E785 Hyperlipidemia, unspecified: Secondary | ICD-10-CM | POA: Insufficient documentation

## 2013-10-26 DIAGNOSIS — I252 Old myocardial infarction: Secondary | ICD-10-CM | POA: Insufficient documentation

## 2013-10-26 DIAGNOSIS — Z23 Encounter for immunization: Secondary | ICD-10-CM | POA: Insufficient documentation

## 2013-10-26 MED ORDER — TETANUS-DIPHTH-ACELL PERTUSSIS 5-2.5-18.5 LF-MCG/0.5 IM SUSP
0.5000 mL | Freq: Once | INTRAMUSCULAR | Status: AC
  Administered 2013-10-26: 0.5 mL via INTRAMUSCULAR
  Filled 2013-10-26: qty 0.5
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
## 2013-10-26 NOTE — ED Provider Notes (Signed)
Medical screening examination/treatment/procedure(s) were performed by non-physician practitioner and as supervising physician I was immediately available for consultation/collaboration.  EKG Interpretation   None         Kane Kusek T Toby Breithaupt, MD 10/26/13 2236 
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
## 2013-11-15 ENCOUNTER — Emergency Department (HOSPITAL_COMMUNITY)
Admission: EM | Admit: 2013-11-15 | Discharge: 2013-11-15 | Disposition: A | Discharge status: Home / Self Care | Payer: BC Managed Care – PPO | Attending: Emergency Medicine | Admitting: Emergency Medicine

## 2013-11-15 ENCOUNTER — Encounter (HOSPITAL_COMMUNITY): Payer: Self-pay | Admitting: Emergency Medicine

## 2013-11-15 DIAGNOSIS — Z87891 Personal history of nicotine dependence: Secondary | ICD-10-CM | POA: Insufficient documentation

## 2013-11-15 DIAGNOSIS — E785 Hyperlipidemia, unspecified: Secondary | ICD-10-CM | POA: Insufficient documentation

## 2013-11-15 DIAGNOSIS — I10 Essential (primary) hypertension: Secondary | ICD-10-CM | POA: Insufficient documentation

## 2013-11-15 DIAGNOSIS — Z79899 Other long term (current) drug therapy: Secondary | ICD-10-CM | POA: Insufficient documentation

## 2013-11-15 DIAGNOSIS — Z95818 Presence of other cardiac implants and grafts: Secondary | ICD-10-CM | POA: Insufficient documentation

## 2013-11-15 DIAGNOSIS — I2510 Atherosclerotic heart disease of native coronary artery without angina pectoris: Secondary | ICD-10-CM | POA: Insufficient documentation

## 2013-11-15 DIAGNOSIS — Z4802 Encounter for removal of sutures: Secondary | ICD-10-CM | POA: Insufficient documentation

## 2013-11-15 DIAGNOSIS — Z7982 Long term (current) use of aspirin: Secondary | ICD-10-CM | POA: Insufficient documentation

## 2013-11-15 DIAGNOSIS — I252 Old myocardial infarction: Secondary | ICD-10-CM | POA: Insufficient documentation

## 2013-11-15 DIAGNOSIS — Z8659 Personal history of other mental and behavioral disorders: Secondary | ICD-10-CM | POA: Insufficient documentation
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
## 2013-11-15 NOTE — ED Notes (Signed)
3 staples removed from pt's head, edges approximated, no bleeding noted

## 2013-11-15 NOTE — ED Provider Notes (Signed)
Medical screening examination/treatment/procedure(s) were performed by non-physician practitioner and as supervising physician I was immediately available for consultation/collaboration.  EKG Interpretation   None         Junius Argyle, MD 11/15/13 [phone]

## 2013-11-15 NOTE — ED Notes (Signed)
Pt here to get staples removed from head.  Denies complaint.

## 2013-11-15 NOTE — ED Notes (Signed)
Here for staple removal

## 2013-12-16 ENCOUNTER — Emergency Department (HOSPITAL_COMMUNITY)
Admission: EM | Admit: 2013-12-16 | Discharge: 2013-12-16 | Disposition: A | Discharge status: Home / Self Care | Payer: BC Managed Care – PPO | Attending: Emergency Medicine | Admitting: Emergency Medicine

## 2013-12-16 ENCOUNTER — Encounter (HOSPITAL_COMMUNITY): Payer: Self-pay | Admitting: Emergency Medicine

## 2013-12-16 DIAGNOSIS — R269 Unspecified abnormalities of gait and mobility: Secondary | ICD-10-CM | POA: Insufficient documentation

## 2013-12-16 DIAGNOSIS — I252 Old myocardial infarction: Secondary | ICD-10-CM | POA: Insufficient documentation

## 2013-12-16 DIAGNOSIS — Z8659 Personal history of other mental and behavioral disorders: Secondary | ICD-10-CM | POA: Insufficient documentation

## 2013-12-16 DIAGNOSIS — E785 Hyperlipidemia, unspecified: Secondary | ICD-10-CM | POA: Insufficient documentation

## 2013-12-16 DIAGNOSIS — M5430 Sciatica, unspecified side: Secondary | ICD-10-CM | POA: Insufficient documentation

## 2013-12-16 DIAGNOSIS — Z87891 Personal history of nicotine dependence: Secondary | ICD-10-CM | POA: Insufficient documentation

## 2013-12-16 DIAGNOSIS — IMO0002 Reserved for concepts with insufficient information to code with codable children: Secondary | ICD-10-CM | POA: Insufficient documentation

## 2013-12-16 DIAGNOSIS — Z7982 Long term (current) use of aspirin: Secondary | ICD-10-CM | POA: Insufficient documentation

## 2013-12-16 DIAGNOSIS — I10 Essential (primary) hypertension: Secondary | ICD-10-CM | POA: Insufficient documentation

## 2013-12-16 DIAGNOSIS — Z79899 Other long term (current) drug therapy: Secondary | ICD-10-CM | POA: Insufficient documentation

## 2013-12-16 DIAGNOSIS — I2510 Atherosclerotic heart disease of native coronary artery without angina pectoris: Secondary | ICD-10-CM | POA: Insufficient documentation

## 2013-12-16 DIAGNOSIS — M5416 Radiculopathy, lumbar region: Secondary | ICD-10-CM

## 2013-12-16 DIAGNOSIS — Z9861 Coronary angioplasty status: Secondary | ICD-10-CM | POA: Insufficient documentation

## 2013-12-16 DIAGNOSIS — Z9889 Other specified postprocedural states: Secondary | ICD-10-CM | POA: Insufficient documentation

## 2013-12-16 MED ORDER — HYDROCODONE-ACETAMINOPHEN 5-325 MG PO TABS: 1.0000 | ORAL_TABLET | Freq: Four times a day (QID) | ORAL | Status: DC | PRN

## 2013-12-16 MED ORDER — PREDNISONE 50 MG PO TABS: 50.0000 mg | ORAL_TABLET | Freq: Every day | ORAL | Status: DC

## 2013-12-16 MED ORDER — METHOCARBAMOL 500 MG PO TABS: 500.0000 mg | ORAL_TABLET | Freq: Two times a day (BID) | ORAL | Status: DC

## 2013-12-16 MED ORDER — IBUPROFEN 200 MG PO TABS
600.0000 mg | ORAL_TABLET | Freq: Once | ORAL | Status: AC
  Administered 2013-12-16: 600 mg via ORAL
  Filled 2013-12-16: qty 3

## 2013-12-16 MED ORDER — DEXAMETHASONE SODIUM PHOSPHATE 10 MG/ML IJ SOLN
10.0000 mg | Freq: Once | INTRAMUSCULAR | Status: AC
  Administered 2013-12-16: 10 mg via INTRAMUSCULAR
  Filled 2013-12-16: qty 1

## 2013-12-16 MED ORDER — IBUPROFEN 600 MG PO TABS: 600.0000 mg | ORAL_TABLET | Freq: Four times a day (QID) | ORAL | Status: DC | PRN

## 2013-12-16 NOTE — ED Provider Notes (Signed)
CSN: [id number]     Arrival date & time 12/16/13  0910 History   First MD Initiated Contact with Patient 12/16/13 [phone]     Chief Complaint  Patient presents with  . Back Pain   (Consider location/radiation/quality/duration/timing/severity/associated sxs/prior Treatment) HPI Comments: SUBJECTIVE:  Frank Webb is a 58 y.o. male who complains of an injury causing low back pain starting 3 weeks ago, and started whilst he was lifting a tire from a pick up truck. The pain is positional with bending or lifting, with radiation down the left leg - towards the knee. Symptoms have been constant since that time. Prior history of back problems: no prior back problems. There is no numbness in the legs.  OBJECTIVE: Vital signs as noted above. Patient appears to be in mild to moderate pain, antalgic gait noted. Lumbosacral spine area reveals no local tenderness or mass. Painful and reduced LS ROM noted. Straight leg raise is positive at 30 degrees on left. DTR's, motor strength and sensation normal, including heel and toe gait.  Peripheral pulses are palpable.  ASSESSMENT:  lumbar strain and with radiculopathy  PLAN: For acute pain, rest, intermittent application of cold packs (later, may switch to heat, but do not sleep on heating pad), analgesics and muscle relaxants are recommended. Discussed longer term treatment plan of prn NSAID's and discussed a home back care exercise program with flexion exercise routine. Proper lifting with avoidance of heavy lifting discussed. Consider Physical Therapy and XRay studies if not improving. Call or return to clinic prn if these symptoms worsen or fail to improve as anticipated.   Patient is a 58 y.o. male presenting with back pain. The history is provided by the patient.  Back Pain Associated symptoms: no numbness     Past Medical History  Diagnosis Date  . Hypertension   . Hyperlipidemia   . Anxiety and depression   . Epistaxis, recurrent   . Fatigue    beta blocker d/c'd  . Syncope     negative EP study 02/2010  . Cardiomyopathy, ischemic     EF 35% in 02/2010 prior to PCI;  echo 07/2010: Moderate LVH, EF 45-50%, mild posterior HK, severe inferior HK, grade 1 diastolic dysfunction, mild MR, mild LAE  . Coronary artery disease     nuclear 2011 - small inferolateral infarct with significant peri-infarct ischemia and ejection fraction of 36%.  LHC 03/06/10: Serial proximal, mid and distal LAD 40%, apical LAD 60%, dCFX serial 70%, OM2 with chronic occlusion, small oOM3 80%, pRCA 40%, mRCA 80%, dRCA 80%, PDA serial 70%, EF 35%.  PCI: Overlapping BMS x 2 (2.75 x 32 mm Veriflex and 2.75 x 16 mm Veriflex) to RCA  . Myocardial infarction, inferior wall     post RCA stenting 03/06/2010    Past Surgical History  Procedure Laterality Date  . Cardiac catheterization  03/06/2010   . Coronary angioplasty with stent placement  03/06/2010     drug-eluting stents in the RCA  . Cardiac catheterization  03/10/2010    This study demonstrates no inducible VT or inducible SVT in a patient with ischemic heart disease, mild LV dysfunction (normal echo, 35% by cath with prior stenting to the RCA)  --     Family History  Problem Relation Age of Onset  . Cancer Mother 472  . Pneumonia Father [id number]  . Diabetes Sister   . Hypertension Sister   . Hypertension Brother   . Hypertension Brother   . Diabetes Brother    History  Substance Use Topics  . Smoking status: Former Smoker -- 0.80 packs/day for 30 years    Types: Cigarettes    Quit date: 05/07/2013  . Smokeless tobacco: Never Used  . Alcohol Use: Yes     Comment: Occ    Review of Systems  Constitutional: Positive for activity change.  Musculoskeletal: Positive for back pain.  Skin: Negative for pallor.  Neurological: Negative for numbness.    Allergies  Review of patient's allergies indicates no known allergies.  Home Medications   Current Outpatient Rx  Name  Route  Sig  Dispense  Refill  .  aspirin 81 MG tablet   Oral   Take 1 tablet (81 mg total) by mouth daily.         . hydrALAZINE (APRESOLINE) 50 MG tablet   Oral   Take 1.5 tablets (75 mg total) by mouth 3 (three) times daily.   270 tablet   3   . olmesartan-hydrochlorothiazide (BENICAR HCT) 40-12.5 MG per tablet   Oral   Take 1 tablet by mouth daily.         . rosuvastatin (CRESTOR) 20 MG tablet   Oral   Take 20 mg by mouth every morning.          BP 168/90  Pulse 94  Temp(Src) 98.4 F (36.9 C) (Oral)  SpO2 98% Physical Exam  Nursing note and vitals reviewed. Constitutional: He appears well-developed.  HENT:  Head: Atraumatic.  Eyes: Conjunctivae are normal.  Neck: Neck supple.  Cardiovascular: Normal rate and regular rhythm.   Pulmonary/Chest: No respiratory distress.  Abdominal: Soft.  Musculoskeletal:  SEE UNDER HPI - objective findings  Neurological: He has normal reflexes.    ED Course  Procedures (including critical care time) Labs Review Labs Reviewed - No data to display Imaging Review No results found.  EKG Interpretation   None       MDM  No diagnosis found.  See under HPI  -plan  Pt has sciatica. No numbness/tingling/weakness, no urinary incontinence, urinary retention, bowel incontinence, saddle anesthesia. Likely sciatica / lumbar radiculopathy. No indication for ED imaging.     Derwood Kaplan, MD 12/16/13 [phone]

## 2013-12-16 NOTE — Discharge Instructions (Signed)
We saw you in the ER for back pain. Our exam don't indicate any spinal cord involvement - this appears to be sciatica. Please take the ibuprofen every 6 hours for the next 2 days, take the muscle relaxant as needed, and see your primary care doctor for further pain control.  Please use the back exercises to strengthen the back muscles.   Back Exercises Back exercises help treat and prevent back injuries. The goal of back exercises is to increase the strength of your abdominal and back muscles and the flexibility of your back. These exercises should be started when you no longer have back pain. Back exercises include:  Pelvic Tilt. Lie on your back with your knees bent. Tilt your pelvis until the lower part of your back is against the floor. Hold this position 5 to 10 sec and repeat 5 to 10 times.  Knee to Chest. Pull first 1 knee up against your chest and hold for 20 to 30 seconds, repeat this with the other knee, and then both knees. This may be done with the other leg straight or bent, whichever feels better.  Sit-Ups or Curl-Ups. Bend your knees 90 degrees. Start with tilting your pelvis, and do a partial, slow sit-up, lifting your trunk only 30 to 45 degrees off the floor. Take at least 2 to 3 seconds for each sit-up. Do not do sit-ups with your knees out straight. If partial sit-ups are difficult, simply do the above but with only tightening your abdominal muscles and holding it as directed.  Hip-Lift. Lie on your back with your knees flexed 90 degrees. Push down with your feet and shoulders as you raise your hips a couple inches off the floor; hold for 10 seconds, repeat 5 to 10 times.  Back arches. Lie on your stomach, propping yourself up on bent elbows. Slowly press on your hands, causing an arch in your low back. Repeat 3 to 5 times. Any initial stiffness and discomfort should lessen with repetition over time.  Shoulder-Lifts. Lie face down with arms beside your body. Keep hips and torso  pressed to floor as you slowly lift your head and shoulders off the floor. Do not overdo your exercises, especially in the beginning. Exercises may cause you some mild back discomfort which lasts for a few minutes; however, if the pain is more severe, or lasts for more than 15 minutes, do not continue exercises until you see your caregiver. Improvement with exercise therapy for back problems is slow.  See your caregivers for assistance with developing a proper back exercise program. Document Released: 12/10/2004 Document Revised: 01/25/2012 Document Reviewed: 09/03/2011 [facility] Patient Information 2014 [place], [place].  Sciatica Sciatica is pain, weakness, numbness, or tingling along the path of the sciatic nerve. The nerve starts in the lower back and runs down the back of each leg. The nerve controls the muscles in the lower leg and in the back of the knee, while also providing sensation to the back of the thigh, lower leg, and the sole of your foot. Sciatica is a symptom of another medical condition. For instance, nerve damage or certain conditions, such as a herniated disk or bone spur on the spine, pinch or put pressure on the sciatic nerve. This causes the pain, weakness, or other sensations normally associated with sciatica. Generally, sciatica only affects one side of the body. CAUSES   Herniated or slipped disc.  Degenerative disk disease.  A pain disorder involving the narrow muscle in the buttocks (piriformis syndrome).  Pelvic injury or fracture.  Pregnancy.  Tumor (rare). SYMPTOMS  Symptoms can vary from mild to very severe. The symptoms usually travel from the low back to the buttocks and down the back of the leg. Symptoms can include:  Mild tingling or dull aches in the lower back, leg, or hip.  Numbness in the back of the calf or sole of the foot.  Burning sensations in the lower back, leg, or hip.  Sharp pains in the lower back, leg, or hip.  Leg weakness.  Severe  back pain inhibiting movement. These symptoms may get worse with coughing, sneezing, laughing, or prolonged sitting or standing. Also, being overweight may worsen symptoms. DIAGNOSIS  Your caregiver will perform a physical exam to look for common symptoms of sciatica. He or she may ask you to do certain movements or activities that would trigger sciatic nerve pain. Other tests may be performed to find the cause of the sciatica. These may include:  Blood tests.  X-rays.  Imaging tests, such as an MRI or CT scan. TREATMENT  Treatment is directed at the cause of the sciatic pain. Sometimes, treatment is not necessary and the pain and discomfort goes away on its own. If treatment is needed, your caregiver may suggest:  Over-the-counter medicines to relieve pain.  Prescription medicines, such as anti-inflammatory medicine, muscle relaxants, or narcotics.  Applying heat or ice to the painful area.  Steroid injections to lessen pain, irritation, and inflammation around the nerve.  Reducing activity during periods of pain.  Exercising and stretching to strengthen your abdomen and improve flexibility of your spine. Your caregiver may suggest losing weight if the extra weight makes the back pain worse.  Physical therapy.  Surgery to eliminate what is pressing or pinching the nerve, such as a bone spur or part of a herniated disk. HOME CARE INSTRUCTIONS   Only take over-the-counter or prescription medicines for pain or discomfort as directed by your caregiver.  Apply ice to the affected area for 20 minutes, 3 4 times a day for the first 48 72 hours. Then try heat in the same way.  Exercise, stretch, or perform your usual activities if these do not aggravate your pain.  Attend physical therapy sessions as directed by your caregiver.  Keep all follow-up appointments as directed by your caregiver.  Do not wear high heels or shoes that do not provide proper support.  Check your mattress to  see if it is too soft. A firm mattress may lessen your pain and discomfort. SEEK IMMEDIATE MEDICAL CARE IF:   You lose control of your bowel or bladder (incontinence).  You have increasing weakness in the lower back, pelvis, buttocks, or legs.  You have redness or swelling of your back.  You have a burning sensation when you urinate.  You have pain that gets worse when you lie down or awakens you at night.  Your pain is worse than you have experienced in the past.  Your pain is lasting longer than 4 weeks.  You are suddenly losing weight without reason. MAKE SURE YOU:  Understand these instructions.  Will watch your condition.  Will get help right away if you are not doing well or get worse. Document Released: 10/27/2001 Document Revised: 05/03/2012 Document Reviewed: 03/13/2012 [facility] Patient Information 2014 [place], [place].  Radicular Pain Radicular pain in either the arm or leg is usually from a bulging or herniated disk in the spine. A piece of the herniated disk may press against the nerves as  the nerves exit the spine. This causes pain which is felt at the tips of the nerves down the arm or leg. Other causes of radicular pain may include:  Fractures.  Heart disease.  Cancer.  An abnormal and usually degenerative state of the nervous system or nerves (neuropathy). Diagnosis may require CT or MRI scanning to determine the primary cause.  Nerves that start at the neck (nerve roots) may cause radicular pain in the outer shoulder and arm. It can spread down to the thumb and fingers. The symptoms vary depending on which nerve root has been affected. In most cases radicular pain improves with conservative treatment. Neck problems may require physical therapy, a neck collar, or cervical traction. Treatment may take many weeks, and surgery may be considered if the symptoms do not improve.  Conservative treatment is also recommended for sciatica. Sciatica causes pain to radiate  from the lower back or buttock area down the leg into the foot. Often there is a history of back problems. Most patients with sciatica are better after 2 to 4 weeks of rest and other supportive care. Short term bed rest can reduce the disk pressure considerably. Sitting, however, is not a good position since this increases the pressure on the disk. You should avoid bending, lifting, and all other activities which make the problem worse. Traction can be used in severe cases. Surgery is usually reserved for patients who do not improve within the first months of treatment. Only take over-the-counter or prescription medicines for pain, discomfort, or fever as directed by your caregiver. Narcotics and muscle relaxants may help by relieving more severe pain and spasm and by providing mild sedation. Cold or massage can give significant relief. Spinal manipulation is not recommended. It can increase the degree of disc protrusion. Epidural steroid injections are often effective treatment for radicular pain. These injections deliver medicine to the spinal nerve in the space between the protective covering of the spinal cord and back bones (vertebrae). Your caregiver can give you more information about steroid injections. These injections are most effective when given within two weeks of the onset of pain.  You should see your caregiver for follow up care as recommended. A program for neck and back injury rehabilitation with stretching and strengthening exercises is an important part of management.  SEEK IMMEDIATE MEDICAL CARE IF:  You develop increased pain, weakness, or numbness in your arm or leg.  You develop difficulty with bladder or bowel control.  You develop abdominal pain. Document Released: 12/10/2004 Document Revised: 01/25/2012 Document Reviewed: 02/25/2009 [facility] Patient Information 2014 [place], [place].

## 2013-12-16 NOTE — ED Notes (Signed)
He c/o low back pain radiating into left hip area.  He is in no distress.  He cites "picked up a big truck tire" about two weeks ago.

## 2014-05-04 ENCOUNTER — Other Ambulatory Visit: Payer: Self-pay | Admitting: Internal Medicine

## 2014-06-13 ENCOUNTER — Other Ambulatory Visit: Payer: Self-pay | Admitting: Internal Medicine

## 2014-08-24 ENCOUNTER — Other Ambulatory Visit: Payer: Self-pay | Admitting: Internal Medicine

## 2014-08-24 ENCOUNTER — Other Ambulatory Visit: Payer: Self-pay | Admitting: Physician Assistant

## 2016-05-14 ENCOUNTER — Observation Stay (HOSPITAL_COMMUNITY)
Admission: EM | Admit: 2016-05-14 | Discharge: 2016-05-17 | Disposition: A | Discharge status: Home / Self Care | Payer: BLUE CROSS/BLUE SHIELD | Attending: Cardiology | Admitting: Cardiology

## 2016-05-14 ENCOUNTER — Emergency Department (HOSPITAL_COMMUNITY): Payer: BLUE CROSS/BLUE SHIELD

## 2016-05-14 ENCOUNTER — Encounter (HOSPITAL_COMMUNITY): Payer: Self-pay | Admitting: Emergency Medicine

## 2016-05-14 ENCOUNTER — Observation Stay (HOSPITAL_COMMUNITY): Payer: BLUE CROSS/BLUE SHIELD

## 2016-05-14 DIAGNOSIS — Z79899 Other long term (current) drug therapy: Secondary | ICD-10-CM | POA: Diagnosis not present

## 2016-05-14 DIAGNOSIS — I252 Old myocardial infarction: Secondary | ICD-10-CM | POA: Insufficient documentation

## 2016-05-14 DIAGNOSIS — I2510 Atherosclerotic heart disease of native coronary artery without angina pectoris: Secondary | ICD-10-CM | POA: Diagnosis not present

## 2016-05-14 DIAGNOSIS — I5043 Acute on chronic combined systolic (congestive) and diastolic (congestive) heart failure: Secondary | ICD-10-CM

## 2016-05-14 DIAGNOSIS — D751 Secondary polycythemia: Secondary | ICD-10-CM | POA: Insufficient documentation

## 2016-05-14 DIAGNOSIS — I5023 Acute on chronic systolic (congestive) heart failure: Secondary | ICD-10-CM | POA: Diagnosis not present

## 2016-05-14 DIAGNOSIS — I340 Nonrheumatic mitral (valve) insufficiency: Secondary | ICD-10-CM | POA: Diagnosis not present

## 2016-05-14 DIAGNOSIS — I5021 Acute systolic (congestive) heart failure: Secondary | ICD-10-CM | POA: Insufficient documentation

## 2016-05-14 DIAGNOSIS — Z720 Tobacco use: Secondary | ICD-10-CM | POA: Diagnosis not present

## 2016-05-14 DIAGNOSIS — J9601 Acute respiratory failure with hypoxia: Secondary | ICD-10-CM | POA: Insufficient documentation

## 2016-05-14 DIAGNOSIS — I214 Non-ST elevation (NSTEMI) myocardial infarction: Secondary | ICD-10-CM

## 2016-05-14 DIAGNOSIS — Z955 Presence of coronary angioplasty implant and graft: Secondary | ICD-10-CM | POA: Diagnosis not present

## 2016-05-14 DIAGNOSIS — I110 Hypertensive heart disease with heart failure: Secondary | ICD-10-CM | POA: Diagnosis not present

## 2016-05-14 DIAGNOSIS — E669 Obesity, unspecified: Secondary | ICD-10-CM | POA: Diagnosis present

## 2016-05-14 DIAGNOSIS — R7989 Other specified abnormal findings of blood chemistry: Secondary | ICD-10-CM | POA: Diagnosis not present

## 2016-05-14 DIAGNOSIS — F341 Dysthymic disorder: Secondary | ICD-10-CM

## 2016-05-14 DIAGNOSIS — E785 Hyperlipidemia, unspecified: Secondary | ICD-10-CM | POA: Diagnosis not present

## 2016-05-14 DIAGNOSIS — R079 Chest pain, unspecified: Secondary | ICD-10-CM

## 2016-05-14 DIAGNOSIS — I161 Hypertensive emergency: Secondary | ICD-10-CM

## 2016-05-14 DIAGNOSIS — J449 Chronic obstructive pulmonary disease, unspecified: Secondary | ICD-10-CM | POA: Diagnosis not present

## 2016-05-14 DIAGNOSIS — Z7982 Long term (current) use of aspirin: Secondary | ICD-10-CM | POA: Insufficient documentation

## 2016-05-14 DIAGNOSIS — I255 Ischemic cardiomyopathy: Secondary | ICD-10-CM | POA: Diagnosis not present

## 2016-05-14 DIAGNOSIS — R778 Other specified abnormalities of plasma proteins: Secondary | ICD-10-CM

## 2016-05-14 DIAGNOSIS — I160 Hypertensive urgency: Secondary | ICD-10-CM

## 2016-05-14 HISTORY — DX: Patient's noncompliance with other medical treatment and regimen: Z91.19

## 2016-05-14 HISTORY — DX: Chronic combined systolic (congestive) and diastolic (congestive) heart failure: I50.42

## 2016-05-14 HISTORY — DX: Patient's noncompliance with other medical treatment and regimen due to unspecified reason: Z91.199

## 2016-05-14 LAB — CBC WITH DIFFERENTIAL/PLATELET
BASOS PCT: 0 %
Basophils Absolute: 0 10*3/uL (ref 0.0–0.1)
Eosinophils Absolute: 0.2 10*3/uL (ref 0.0–0.7)
Eosinophils Relative: 2 %
HCT: 48.3 % (ref 39.0–52.0)
HEMOGLOBIN: 15.7 g/dL (ref 13.0–17.0)
Lymphocytes Relative: 19 %
Lymphs Abs: 1.8 10*3/uL (ref 0.7–4.0)
MCH: 25.4 pg — ABNORMAL LOW (ref 26.0–34.0)
MCHC: 32.5 g/dL (ref 30.0–36.0)
MCV: 78.3 fL (ref 78.0–100.0)
Monocytes Absolute: 0.5 10*3/uL (ref 0.1–1.0)
Monocytes Relative: 5 %
NEUTROS PCT: 73 %
Neutro Abs: 6.8 10*3/uL (ref 1.7–7.7)
Platelets: 235 10*3/uL (ref 150–400)
RBC: 6.17 MIL/uL — AB (ref 4.22–5.81)
RDW: 16 % — ABNORMAL HIGH (ref 11.5–15.5)
WBC: 9.3 10*3/uL (ref 4.0–10.5)

## 2016-05-14 LAB — I-STAT CHEM 8, ED
BUN: 16 mg/dL (ref 6–20)
CALCIUM ION: 1.12 mmol/L (ref 1.12–1.23)
Chloride: 105 mmol/L (ref 101–111)
Creatinine, Ser: 1.2 mg/dL (ref 0.61–1.24)
Glucose, Bld: 121 mg/dL — ABNORMAL HIGH (ref 65–99)
HCT: 52 % (ref 39.0–52.0)
HEMOGLOBIN: 17.7 g/dL — AB (ref 13.0–17.0)
Potassium: 3.7 mmol/L (ref 3.5–5.1)
SODIUM: 142 mmol/L (ref 135–145)
TCO2: 25 mmol/L (ref 0–100)

## 2016-05-14 LAB — BASIC METABOLIC PANEL
ANION GAP: 7 (ref 5–15)
BUN: 13 mg/dL (ref 6–20)
CHLORIDE: 106 mmol/L (ref 101–111)
CO2: 25 mmol/L (ref 22–32)
Calcium: 9.1 mg/dL (ref 8.9–10.3)
Creatinine, Ser: 1.28 mg/dL — ABNORMAL HIGH (ref 0.61–1.24)
GFR, EST NON AFRICAN AMERICAN: 59 mL/min — AB (ref >60)
Glucose, Bld: 120 mg/dL — ABNORMAL HIGH (ref 65–99)
Potassium: 3.8 mmol/L (ref 3.5–5.1)
SODIUM: 138 mmol/L (ref 135–145)

## 2016-05-14 LAB — I-STAT TROPONIN, ED: TROPONIN I, POC: 0.09 ng/mL — AB (ref 0.00–0.08)

## 2016-05-14 LAB — TROPONIN I: Troponin I: 0.21 ng/mL (ref <0.03)

## 2016-05-14 MED ORDER — LOSARTAN POTASSIUM 50 MG PO TABS
50.0000 mg | ORAL_TABLET | Freq: Every day | ORAL | Status: DC
  Administered 2016-05-15: 50 mg via ORAL
  Filled 2016-05-14: qty 1

## 2016-05-14 MED ORDER — AMLODIPINE BESYLATE 10 MG PO TABS
10.0000 mg | ORAL_TABLET | Freq: Every day | ORAL | Status: DC
  Administered 2016-05-14 – 2016-05-17 (×4): 10 mg via ORAL
  Filled 2016-05-14 (×2): qty 1
  Filled 2016-05-14: qty 2
  Filled 2016-05-14: qty 1

## 2016-05-14 MED ORDER — HEPARIN BOLUS VIA INFUSION
4000.0000 [IU] | Freq: Once | INTRAVENOUS | Status: AC
  Administered 2016-05-14: 4000 [IU] via INTRAVENOUS
  Filled 2016-05-14: qty 4000

## 2016-05-14 MED ORDER — HEPARIN (PORCINE) IN NACL 100-0.45 UNIT/ML-% IJ SOLN
1400.0000 [IU]/h | INTRAMUSCULAR | Status: DC
  Administered 2016-05-14: 1300 [IU]/h via INTRAVENOUS
  Administered 2016-05-15: 1400 [IU]/h via INTRAVENOUS
  Filled 2016-05-14 (×3): qty 250

## 2016-05-14 MED ORDER — ASPIRIN 81 MG PO CHEW
81.0000 mg | CHEWABLE_TABLET | Freq: Every day | ORAL | Status: DC
  Administered 2016-05-15 – 2016-05-17 (×3): 81 mg via ORAL
  Filled 2016-05-14 (×3): qty 1

## 2016-05-14 MED ORDER — HYDRALAZINE HCL 50 MG PO TABS
50.0000 mg | ORAL_TABLET | Freq: Three times a day (TID) | ORAL | Status: DC
  Administered 2016-05-14 – 2016-05-16 (×6): 50 mg via ORAL
  Filled 2016-05-14 (×6): qty 1

## 2016-05-14 MED ORDER — FUROSEMIDE 10 MG/ML IJ SOLN
40.0000 mg | Freq: Two times a day (BID) | INTRAMUSCULAR | Status: DC
  Administered 2016-05-15 – 2016-05-17 (×5): 40 mg via INTRAVENOUS
  Filled 2016-05-14 (×5): qty 4

## 2016-05-14 MED ORDER — IOPAMIDOL (ISOVUE-370) INJECTION 76%
INTRAVENOUS | Status: AC
  Administered 2016-05-14: 100 mL
  Filled 2016-05-14: qty 100

## 2016-05-14 MED ORDER — ASPIRIN 81 MG PO CHEW
324.0000 mg | CHEWABLE_TABLET | Freq: Once | ORAL | Status: AC
  Administered 2016-05-14: 324 mg via ORAL
  Filled 2016-05-14: qty 4

## 2016-05-14 MED ORDER — ONDANSETRON HCL 4 MG/2ML IJ SOLN: 4.0000 mg | Freq: Four times a day (QID) | INTRAMUSCULAR | Status: DC | PRN

## 2016-05-14 MED ORDER — NITROGLYCERIN 0.4 MG SL SUBL
0.4000 mg | SUBLINGUAL_TABLET | SUBLINGUAL | Status: DC | PRN
  Filled 2016-05-14: qty 1

## 2016-05-14 MED ORDER — ACETAMINOPHEN 325 MG PO TABS: 650.0000 mg | ORAL_TABLET | ORAL | Status: DC | PRN

## 2016-05-14 MED ORDER — ATORVASTATIN CALCIUM 80 MG PO TABS
80.0000 mg | ORAL_TABLET | Freq: Every day | ORAL | Status: DC
  Administered 2016-05-14 – 2016-05-16 (×3): 80 mg via ORAL
  Filled 2016-05-14 (×3): qty 1

## 2016-05-14 NOTE — H&P (Addendum)
Admit date: 05/14/2016 Primary Physician  Geraldo PitterBLAND,VEITA J, MD Primary Cardiologist  Dr. Graciela HusbandsKlein  CC: shortness of breath  HPI: 60 year old male with history of ischemic cardiomyopathy ejection fraction 36%, hypertension, hyperlipidemia, tobacco use here with chest pain/shortness of breath, wheezing.  Shortness of breath has been ongoing for over 1 month but worsening today. Seemed diaphoretic and gray on initial presentation with EMS. He's had a cough since April with wheezing bilaterally and exertional shortness of breath worsening in frequency and severity. Today he was running up the stairs and became more acutely short of breath. It was hard for him to catch his breath afterwards. He is also noticed his abdomen becoming more protuberant. When cutting the grass over the past week or 2 he has had to stop after 1 or 2 rows to catch his breath. He also noted this once after sexual activity with his girlfriend approximately one month ago. He has not noticed any significant unilateral leg swelling. No fevers, chills. He has had some wheezing and cough. Nonproductive. He has been out of his blood pressure medication since last December.  Via EMS he was given albuterol and IV Solu-Medrol. His blood pressure was 204/132 on arrival with a heart rate of 111 satting 93-99% on nonrebreather. In the emergency department he was 88% on room air.  EKG was performed in the emergency department and there was concern for acute coronary syndrome. According to Dr. Adela LankFloyd, EKG appears similar though there is mild less than 1 mm of ST elevation in the anterior leads. EMS EKG is reviewed without such change. I personally reviewed his prior EKGs from 05/04/13 and there is no significant change, he does have diffuse T-wave inversion/repolarization abnormality appearance as well as early repolarization in the V2 and V3 distribution. Does not appear to be ST elevation myocardial infarction. CT angiogram of the chest was performed  to exclude pulmonary embolism.  Troponin was drawn and was mildly elevated point-of-care marker at 0.09.  He has seen Dr. Graciela HusbandsKlein in the past for unexplained syncope, electrophysiology study done negative. Previous epistaxis on Effient. Has had trouble with fatigue and beta blockers in the past. His last visit to cardiology was in 2014.  Studies reviewed:  Left heart catheterization 03/06/10  Serial proximal, mid and distal LAD 40%, apical LAD 60%, dCFX serial 70%, OM2 with chronic occlusion, small oOM3 80%, pRCA 40%, mRCA 80%, dRCA 80%, PDA serial 70%, EF 35%. PCI: Overlapping BMS x 2 (2.75 x 32 mm Veriflex and 2.75 x 16 mm Veriflex) to the mid and distal RCA  Renal ultrasound 03/2012 showed no renal artery stenosis  PMH:   Past Medical History  Diagnosis Date  . Hypertension   . Hyperlipidemia   . Anxiety and depression   . Epistaxis, recurrent   . Fatigue     beta blocker d/c'd  . Syncope     negative EP study 02/2010  . Cardiomyopathy, ischemic     EF 35% in 02/2010 prior to PCI;  echo 07/2010: Moderate LVH, EF 45-50%, mild posterior HK, severe inferior HK, grade 1 diastolic dysfunction, mild MR, mild LAE  . Coronary artery disease     nuclear 2011 - small inferolateral infarct with significant peri-infarct ischemia and ejection fraction of 36%.  LHC 03/06/10: Serial proximal, mid and distal LAD 40%, apical LAD 60%, dCFX serial 70%, OM2 with chronic occlusion, small oOM3 80%, pRCA 40%, mRCA 80%, dRCA 80%, PDA serial 70%, EF 35%.  PCI: Overlapping BMS x 2 (2.75 x  32 mm Veriflex and 2.75 x 16 mm Veriflex) to RCA  . Myocardial infarction, inferior wall (HCC)     post RCA stenting 03/06/2010     PSH:   Past Surgical History  Procedure Laterality Date  . Cardiac catheterization  03/06/2010   . Coronary angioplasty with stent placement  03/06/2010     drug-eluting stents in the RCA  . Cardiac catheterization  03/10/2010    This study demonstrates no inducible VT or inducible SVT in a  patient with ischemic heart disease, mild LV dysfunction (normal echo, 35% by cath with prior stenting to the RCA)  --     Allergies:  Review of patient's allergies indicates no known allergies. Prior to Admit Meds:   Prior to Admission medications   Medication Sig Start Date End Date Taking? Authorizing Provider  amLODipine (NORVASC) 10 MG tablet TAKE ONE TABLET BY MOUTH ONCE DAILY 05/04/14   Duke Salvia, MD  amLODipine (NORVASC) 10 MG tablet TAKE ONE TABLET BY MOUTH ONCE DAILY-- PT NEEDS APPT FOR FURTHER REFILLS 08/27/14   Duke Salvia, MD  aspirin 81 MG tablet Take 1 tablet (81 mg total) by mouth daily. 04/13/13   Duke Salvia, MD  hydrALAZINE (APRESOLINE) 50 MG tablet TAKE ONE & ONE-HALF TABLETS BY MOUTH THREE TIMES DAILY 08/27/14   Duke Salvia, MD  HYDROcodone-acetaminophen (NORCO/VICODIN) 5-325 MG per tablet Take 1 tablet by mouth every 6 (six) hours as needed. 12/16/13   Derwood Kaplan, MD  ibuprofen (ADVIL,MOTRIN) 600 MG tablet Take 1 tablet (600 mg total) by mouth every 6 (six) hours as needed. 12/16/13   Derwood Kaplan, MD  methocarbamol (ROBAXIN) 500 MG tablet Take 1 tablet (500 mg total) by mouth 2 (two) times daily. 12/16/13   Derwood Kaplan, MD  olmesartan-hydrochlorothiazide (BENICAR HCT) 40-12.5 MG per tablet Take 1 tablet by mouth daily.    Historical Provider, MD  predniSONE (DELTASONE) 50 MG tablet Take 1 tablet (50 mg total) by mouth daily. 12/16/13   Derwood Kaplan, MD  rosuvastatin (CRESTOR) 20 MG tablet Take 20 mg by mouth every morning.    Historical Provider, MD   Fam HX:    Family History  Problem Relation Age of Onset  . Cancer Mother 49  . Pneumonia Father 8  . Diabetes Sister   . Hypertension Sister   . Hypertension Brother   . Hypertension Brother   . Diabetes Brother    Social HX:    Social History   Social History  . Marital Status: Single    Spouse Name: N/A  . Number of Children: N/A  . Years of Education: N/A   Occupational History  . Not  on file.   Social History Main Topics  . Smoking status: Former Smoker -- 0.80 packs/day for 30 years    Types: Cigarettes    Quit date: 05/07/2013  . Smokeless tobacco: Never Used  . Alcohol Use: Yes     Comment: Occ  . Drug Use: No  . Sexual Activity: Not on file   Other Topics Concern  . Not on file   Social History Narrative     ROS:  All 11 ROS were addressed and are negative except what is stated in the HPI   Physical Exam: Blood pressure 161/90, pulse 95, temperature 98.9 F (37.2 C), temperature source Oral, resp. rate 21, height 5\' 10"  (1.778 m), weight 212 lb (96.163 kg), SpO2 94 %.   General: Well developed, well nourished, in no acute distress  Head: Eyes PERRLA, No xanthomas.   Normal cephalic and atramatic  Lungs:  No significant increased work of breathing, appears quite comfortable in bed. Does have left lung wheezes heard posteriorly. Heart:  HRRR S1 S2 Pulses are 2+ & equal. No murmurs, rubs or gallops.             No carotid bruit. Mid neck JVD.  No abdominal bruits. Abdomen: Protuberant abdomen Bowel sounds are positive, abdomen soft and non-tender without masses or                 Hernia's noted. No hepatosplenomegaly. Msk:  Back normal, normal gait. Normal strength and tone for age. Extremities:  No clubbing, cyanosis or edema.  DP +1 Neuro: Alert and oriented X 3, non-focal, MAE x 4 GU: Deferred Rectal: Deferred Psych:  Good affect, responds appropriately         Labs:   Lab Results  Component Value Date   WBC 9.3 05/14/2016   HGB 17.7* 05/14/2016   HCT 52.0 05/14/2016   MCV 78.3 05/14/2016   PLT PENDING 05/14/2016    Recent Labs Lab 05/14/16 1733 05/14/16 1744  NA 138 142  K 3.8 3.7  CL 106 105  CO2 25  --   BUN 13 16  CREATININE 1.28* 1.20  CALCIUM 9.1  --   GLUCOSE 120* 121*   No results for input(s): CKTOTAL, CKMB, TROPONINI in the last 72 hours. Lab Results  Component Value Date   CHOL * 03/02/2010    230        ATP  III CLASSIFICATION:  <200     mg/dL   Desirable  [id number]  mg/dL   Borderline High  >=045>=240    mg/dL   High          HDL 36* 03/02/2010   LDLCALC * 03/02/2010    170        Total Cholesterol/HDL:CHD Risk Coronary Heart Disease Risk Table                     Men   Women  1/2 Average Risk   3.4   3.3  Average Risk       5.0   4.4  2 X Average Risk   9.6   7.1  3 X Average Risk  23.4   11.0        Use the calculated Patient Ratio above and the CHD Risk Table to determine the patient's CHD Risk.        ATP III CLASSIFICATION (LDL):  <100     mg/dL   Optimal  [id number]  mg/dL   Near or Above                    Optimal  130-159  mg/dL   Borderline  [id number]  mg/dL   High  >956>190     mg/dL   Very High   TRIG [id number] 03/02/2010   No results found for: DDIMER   Radiology:  Dg Chest 2 View  05/14/2016  CLINICAL DATA:  Shortness of breath.  History of cardiomyopathy. EXAM: CHEST  2 VIEW COMPARISON:  March 01, 2010 FINDINGS: The heart size is borderline with no gross cardiomegaly. The hila and mediastinum are normal. Bilateral, right greater than left, increased opacities which are largely interstitial but a little more focal in the medial right lung base. No nodules or masses. No other acute abnormalities. IMPRESSION: Bilateral pulmonary opacities, right greater than left,  could represent developing asymmetric edema, especially given the history of cardiomyopathy. However, the heart is not particularly enlarged and atypical infections should also be entertained. Electronically Signed   By: Gerome Sam III M.D   On: 05/14/2016 17:57   Personally viewed.   EKG:  As described above in history of present illness Personally viewed.  ASSESSMENT/PLAN:   60 year old male with history of ischemic cardiomyopathy, chronic systolic heart failure with ongoing shortness of breath, wheezing, chest x-ray compatible with pulmonary edema, mildly elevated point-of-care troponin 0.09, with markedly elevated blood  pressure 200 systolic consistent with hypertensive urgency/acute on chronic systolic heart failure.  Acute on chronic systolic heart failure  - We will go ahead and restart hydralazine, angiotensin receptor blocker.  - IV Lasix 40 mg twice a day  - Monitor daily basic metabolic profile  - Monitor daily weights, strict I's and O's, salt and fluid restrictions  - After we are able to diurese him, I would like to try once again beta blocker, carvedilol.  - Check echocardiogram  - Cycle cardiac biomarkers, if markedly elevated or trend significantly upwards, this may be secondary to acute coronary syndrome and he may warrant cardiac catheterization especially given his underlying history of coronary artery disease/PCI. If troponin remains flat this is likely demand ischemia in the setting of hypertensive urgency/systolic heart failure.  Elevated troponin  - As described above, it remains flat and low, may very well be demand ischemia in the setting of hypertensive emergency/systolic heart failure  - If increases, this may be acute coronary syndrome  - For now, continue IV heparin with anticipation for discontinuation tomorrow morning if enzymes are flat.  Hypoxemic respiratory failure, acute  - Likely secondary to pulmonary edema in the setting of systolic heart failure and uncontrolled hypertension/hypertensive emergency  - Emergency department has ordered CT scan of chest to exclude pulmonary embolism. This seems reasonable.  - There is no evidence of pleural effusion. No clear evidence of pneumonia. White count 9.3, normal Hemoglobin 17.7 compatible with chronic smoking  - If he does not improve despite IV diuresis, we should likely switch gears and begin treatment for possible COPD-like exacerbation.  Tobacco abuse - Tobacco cessation counseling  Donato Schultz, MD  05/14/2016  6:49 PM

## 2016-05-14 NOTE — ED Notes (Signed)
EMS - Patient coming from home with c/o of SOB ongoing x 1 month but worsening today.  Patient was diaphoretic and grey on initial presentation with EMS.  Patient is having cough since April, wheezing bilateral and exertional SOB.  Patient is out of BP meds since December.  Given 5mg  Albuterol and 125mg  Solumedrol with EMS.  EKG unremarkable.  204/132 BP, HR 111 and 93-99% on nonrebreather.    ED - O2 on room air 88%.

## 2016-05-14 NOTE — ED Provider Notes (Signed)
CSN: [id number]     Arrival date & time 05/14/16  1704 History   First MD Initiated Contact with Patient 05/14/16 1707     Chief Complaint  Patient presents with  . Shortness of Breath     (Consider location/radiation/quality/duration/timing/severity/associated sxs/prior Treatment) Patient is a 60 y.o. male presenting with shortness of breath. The history is provided by the patient.  Shortness of Breath Severity:  Severe Onset quality:  Sudden Duration:  1 hour Timing:  Constant Progression:  Partially resolved Chronicity:  New Context comment:  Exertion Relieved by:  Inhaler and rest Worsened by:  Nothing tried Ineffective treatments:  None tried Associated symptoms: diaphoresis and wheezing   Associated symptoms: no abdominal pain, no chest pain, no fever, no headaches, no rash and no vomiting   Risk factors: tobacco use    60 yo M With a chief complaint of exertional shortness of breath. Patient ran up and down a flight of stairs and then suddenly felt he couldn't catch his breath. Sat down to rest was unable to breathe comfortably. Like his to pass out and got diaphoretic. Denies chest pain or chest pressure. Call 911. Received a breathing treatment en route. Patient feeling mildly better. He states that over the past couple weeks when he mows the lawn he has to stop after doing a short distance and becomes really short of breath. This usually resolves after about 10 or 15 minute break.  Past Medical History  Diagnosis Date  . Hypertension   . Hyperlipidemia   . Anxiety and depression   . Epistaxis, recurrent   . Fatigue     beta blocker d/c'd  . Syncope     negative EP study 02/2010  . Cardiomyopathy, ischemic     EF 35% in 02/2010 prior to PCI;  echo 07/2010: Moderate LVH, EF 45-50%, mild posterior HK, severe inferior HK, grade 1 diastolic dysfunction, mild MR, mild LAE  . Coronary artery disease     nuclear 2011 - small inferolateral infarct with significant peri-infarct  ischemia and ejection fraction of 36%.  LHC 03/06/10: Serial proximal, mid and distal LAD 40%, apical LAD 60%, dCFX serial 70%, OM2 with chronic occlusion, small oOM3 80%, pRCA 40%, mRCA 80%, dRCA 80%, PDA serial 70%, EF 35%.  PCI: Overlapping BMS x 2 (2.75 x 32 mm Veriflex and 2.75 x 16 mm Veriflex) to RCA  . Myocardial infarction, inferior wall (HCC)     post RCA stenting 03/06/2010    Past Surgical History  Procedure Laterality Date  . Cardiac catheterization  03/06/2010   . Coronary angioplasty with stent placement  03/06/2010     drug-eluting stents in the RCA  . Cardiac catheterization  03/10/2010    This study demonstrates no inducible VT or inducible SVT in a patient with ischemic heart disease, mild LV dysfunction (normal echo, 35% by cath with prior stenting to the RCA)  --     Family History  Problem Relation Age of Onset  . Cancer Mother 7172  . Pneumonia Father [id number]  . Diabetes Sister   . Hypertension Sister   . Hypertension Brother   . Hypertension Brother   . Diabetes Brother    Social History  Substance Use Topics  . Smoking status: Former Smoker -- 0.80 packs/day for 30 years    Types: Cigarettes    Quit date: 05/07/2013  . Smokeless tobacco: Never Used  . Alcohol Use: Yes     Comment: Occ    Review of Systems  Constitutional: Positive for diaphoresis. Negative for fever and chills.  HENT: Negative for congestion and facial swelling.   Eyes: Negative for discharge and visual disturbance.  Respiratory: Positive for shortness of breath and wheezing.   Cardiovascular: Negative for chest pain and palpitations.  Gastrointestinal: Negative for vomiting, abdominal pain and diarrhea.  Musculoskeletal: Negative for myalgias and arthralgias.  Skin: Negative for color change and rash.  Neurological: Negative for tremors, syncope and headaches.  Psychiatric/Behavioral: Negative for confusion and dysphoric mood.      Allergies  Review of patient's allergies indicates no  known allergies.  Home Medications   Prior to Admission medications   Medication Sig Start Date End Date Taking? Authorizing Provider  Chlorphen-Phenyleph-ASA (ALKA-SELTZER PLUS COLD PO) Take 1 packet by mouth at bedtime as needed (cough).   Yes Historical Provider, MD  amLODipine (NORVASC) 10 MG tablet TAKE ONE TABLET BY MOUTH ONCE DAILY Patient not taking: Reported on 05/14/2016 05/04/14   Duke Salvia, MD  amLODipine (NORVASC) 10 MG tablet TAKE ONE TABLET BY MOUTH ONCE DAILY-- PT NEEDS APPT FOR FURTHER REFILLS Patient not taking: Reported on 05/14/2016 08/27/14   Duke Salvia, MD  hydrALAZINE (APRESOLINE) 50 MG tablet TAKE ONE & ONE-HALF TABLETS BY MOUTH THREE TIMES DAILY Patient not taking: Reported on 05/14/2016 08/27/14   Duke Salvia, MD  HYDROcodone-acetaminophen (NORCO/VICODIN) 5-325 MG per tablet Take 1 tablet by mouth every 6 (six) hours as needed. Patient not taking: Reported on 05/14/2016 12/16/13   Derwood Kaplan, MD  ibuprofen (ADVIL,MOTRIN) 600 MG tablet Take 1 tablet (600 mg total) by mouth every 6 (six) hours as needed. Patient not taking: Reported on 05/14/2016 12/16/13   Derwood Kaplan, MD  methocarbamol (ROBAXIN) 500 MG tablet Take 1 tablet (500 mg total) by mouth 2 (two) times daily. Patient not taking: Reported on 05/14/2016 12/16/13   Derwood Kaplan, MD   BP 177/93 mmHg  Pulse 76  Temp(Src) 98.9 F (37.2 C) (Oral)  Resp 18  Ht 5\' 10"  (1.778 m)  Wt 212 lb (96.163 kg)  BMI 30.42 kg/m2  SpO2 94% Physical Exam  Constitutional: He is oriented to person, place, and time. He appears well-developed and well-nourished.  HENT:  Head: Normocephalic and atraumatic.  Eyes: EOM are normal. Pupils are equal, round, and reactive to light.  Neck: Normal range of motion. Neck supple. No JVD present.  Cardiovascular: Normal rate and regular rhythm.  Exam reveals no gallop and no friction rub.   No murmur heard. Pulmonary/Chest: No respiratory distress. He has no wheezes.   Abdominal: He exhibits no distension. There is no tenderness. There is no rebound and no guarding.  Musculoskeletal: Normal range of motion.  Neurological: He is alert and oriented to person, place, and time.  Skin: No rash noted. No pallor.  Psychiatric: He has a normal mood and affect. His behavior is normal.  Nursing note and vitals reviewed.   ED Course  Procedures (including critical care time) Labs Review Labs Reviewed  CBC WITH DIFFERENTIAL/PLATELET - Abnormal; Notable for the following:    RBC 6.17 (*)    MCH 25.4 (*)    RDW 16.0 (*)    All other components within normal limits  BASIC METABOLIC PANEL - Abnormal; Notable for the following:    Glucose, Bld 120 (*)    Creatinine, Ser 1.28 (*)    GFR calc non Af Amer 59 (*)    All other components within normal limits  I-STAT TROPOININ, ED - Abnormal; Notable for the  following:    Troponin i, poc 0.09 (*)    All other components within normal limits  I-STAT CHEM 8, ED - Abnormal; Notable for the following:    Glucose, Bld 121 (*)    Hemoglobin 17.7 (*)    All other components within normal limits  HEPARIN LEVEL (UNFRACTIONATED)  CBC    Imaging Review Dg Chest 2 View  05/14/2016  CLINICAL DATA:  Shortness of breath.  History of cardiomyopathy. EXAM: CHEST  2 VIEW COMPARISON:  March 01, 2010 FINDINGS: The heart size is borderline with no gross cardiomegaly. The hila and mediastinum are normal. Bilateral, right greater than left, increased opacities which are largely interstitial but a little more focal in the medial right lung base. No nodules or masses. No other acute abnormalities. IMPRESSION: Bilateral pulmonary opacities, right greater than left, could represent developing asymmetric edema, especially given the history of cardiomyopathy. However, the heart is not particularly enlarged and atypical infections should also be entertained. Electronically Signed   By: Gerome Sam III M.D   On: 05/14/2016 17:57   I have  personally reviewed and evaluated these images and lab results as part of my medical decision-making.   EKG Interpretation   Date/Time:  Thursday May 14 2016 17:04:42 EDT Ventricular Rate:  109 PR Interval:    QRS Duration: 100 QT Interval:  338 QTC Calculation: 456 R Axis:   96 Text Interpretation:  Sinus tachycardia Biatrial enlargement Abnormal T,  consider ischemia, lateral leads flipped t waves in inferior and lateral  leads seen on prior. <49mm of ST elevation in lead v2,v3 Confirmed by Taraann Olthoff  MD, DANIEL [phone]) on 05/14/2016 5:09:33 PM       Discussed smoking cessation with patient and was they were offerred resources to help stop.  Total time was 5 min CPT code 60454.    MDM   Final diagnoses:  NSTEMI (non-ST elevated myocardial infarction) [facility])    60 yo M with a chief complaint shortness of breath. This is exertional in nature. I'm concerned that this may be ACS. EKG appears similar though there is mild less than 1 mm elevation in the anterior leads. EMS EKG is reviewed without such change. Will repeat the EKG. Patient feeling much better at this time will give aspirin nitroglycerin. As patient's symptoms are exertional is possible that he has a PE. He is also hypoxic on room air and does not have an oxygen requirement. CT angiogram of the chest ordered.  Trop + discussed with cards, started on nitro gtt. Admit.   The patients results and plan were reviewed and discussed.   Any x-rays performed were independently reviewed by myself.   Differential diagnosis were considered with the presenting HPI.  Medications  nitroGLYCERIN (NITROSTAT) SL tablet 0.4 mg (not administered)  heparin ADULT infusion 100 units/mL (25000 units/226mL sodium chloride 0.45%) (1,300 Units/hr Intravenous New Bag/Given 05/14/16 1831)  amLODipine (NORVASC) tablet 10 mg (not administered)  hydrALAZINE (APRESOLINE) tablet 50 mg (not administered)  aspirin chewable tablet 324 mg (324 mg Oral Given  05/14/16 1817)  heparin bolus via infusion 4,000 Units (4,000 Units Intravenous Given 05/14/16 1833)  iopamidol (ISOVUE-370) 76 % injection (100 mLs  Contrast Given 05/14/16 2015)    Filed Vitals:   05/14/16 1900 05/14/16 1930 05/14/16 1945 05/14/16 2000  BP: 199/93 185/88 174/88 177/93  Pulse: 87 79 81 76  Temp:      TempSrc:      Resp: Height:  Weight:      SpO2: 94% 96% 94% 94%    Final diagnoses:  NSTEMI (non-ST elevated myocardial infarction) [facility](HCC)    Admission/ observation were discussed with the admitting physician, patient and/or family and they are comfortable with the plan.     Melene Planan Ellayna Hilligoss, DO 05/14/16 2025

## 2016-05-14 NOTE — ED Notes (Signed)
Patient transported to CT 

## 2016-05-14 NOTE — Progress Notes (Signed)
ANTICOAGULATION CONSULT NOTE - Initial Consult  Pharmacy Consult for Heparin Indication: chest pain/ACS  No Known Allergies  Patient Measurements: Height: 5\' 10"  (177.8 cm) Weight: 212 lb (96.163 kg) IBW/kg (Calculated) : 73 Heparin Dosing Weight: 93 kg  Vital Signs: Temp: 98.9 F (37.2 C) (06/29 1709) Temp Source: Oral (06/29 1709) BP: 196/83 mmHg (06/29 1709) Pulse Rate: 104 (06/29 1709)  Labs:  Recent Labs  05/14/16 1744  HGB 17.7*  HCT 52.0  CREATININE 1.20    Estimated Creatinine Clearance: 76.2 mL/min (by C-G formula based on Cr of 1.2).   Medical History: Past Medical History  Diagnosis Date  . Hypertension   . Hyperlipidemia   . Anxiety and depression   . Epistaxis, recurrent   . Fatigue     beta blocker d/c'd  . Syncope     negative EP study 02/2010  . Cardiomyopathy, ischemic     EF 35% in 02/2010 prior to PCI;  echo 07/2010: Moderate LVH, EF 45-50%, mild posterior HK, severe inferior HK, grade 1 diastolic dysfunction, mild MR, mild LAE  . Coronary artery disease     nuclear 2011 - small inferolateral infarct with significant peri-infarct ischemia and ejection fraction of 36%.  LHC 03/06/10: Serial proximal, mid and distal LAD 40%, apical LAD 60%, dCFX serial 70%, OM2 with chronic occlusion, small oOM3 80%, pRCA 40%, mRCA 80%, dRCA 80%, PDA serial 70%, EF 35%.  PCI: Overlapping BMS x 2 (2.75 x 32 mm Veriflex and 2.75 x 16 mm Veriflex) to RCA  . Myocardial infarction, inferior wall (HCC)     post RCA stenting 03/06/2010     Medications:   (Not in a hospital admission) Scheduled:  . aspirin  324 mg Oral Once   Infusions:    Assessment: 60yo male with history of HTN, HLD ICM and CAD presents with SOB. Pharmacy is consulted to dose heparin for ACS/chest pain. Trop 0.09.  Goal of Therapy:  Heparin level 0.3-0.7 units/ml Monitor platelets by anticoagulation protocol: Yes   Plan:  Give 4000 units bolus x 1 Start heparin infusion at 1300  units/hr Check anti-Xa level in 6 hours and daily while on heparin Continue to monitor H&H and platelets  Arlean Hoppingorey M. Newman PiesBall, PharmD, BCPS Clinical Pharmacist Pager [phone] 05/14/2016,6:02 PM

## 2016-05-15 ENCOUNTER — Observation Stay (HOSPITAL_COMMUNITY): Payer: BLUE CROSS/BLUE SHIELD

## 2016-05-15 ENCOUNTER — Observation Stay (HOSPITAL_BASED_OUTPATIENT_CLINIC_OR_DEPARTMENT_OTHER): Payer: BLUE CROSS/BLUE SHIELD

## 2016-05-15 DIAGNOSIS — I110 Hypertensive heart disease with heart failure: Secondary | ICD-10-CM | POA: Diagnosis not present

## 2016-05-15 DIAGNOSIS — I509 Heart failure, unspecified: Secondary | ICD-10-CM | POA: Diagnosis not present

## 2016-05-15 DIAGNOSIS — E669 Obesity, unspecified: Secondary | ICD-10-CM | POA: Diagnosis not present

## 2016-05-15 DIAGNOSIS — I5043 Acute on chronic combined systolic (congestive) and diastolic (congestive) heart failure: Secondary | ICD-10-CM | POA: Diagnosis not present

## 2016-05-15 DIAGNOSIS — I5021 Acute systolic (congestive) heart failure: Secondary | ICD-10-CM

## 2016-05-15 DIAGNOSIS — I255 Ischemic cardiomyopathy: Secondary | ICD-10-CM | POA: Diagnosis not present

## 2016-05-15 LAB — ECHOCARDIOGRAM COMPLETE
CHL CUP MV DEC (S): 232
E decel time: 232 msec
E/e' ratio: 9.03
FS: 23 % — AB (ref 28–44)
HEIGHTINCHES: 70 in
IV/PV OW: 0.82
LA ID, A-P, ES: 45 mm
LA diam end sys: 45 mm
LA vol A4C: 78.8 ml
LA vol index: 37.7 mL/m2
LA vol: 82.2 mL
LADIAMINDEX: 2.06 cm/m2
LDCA: 4.15 cm2
LV E/e' medial: 9.03
LV e' LATERAL: 9.46 cm/s
LVEEAVG: 9.03
LVOT diameter: 23 mm
MV Peak grad: 3 mmHg
MV pk A vel: 71.8 m/s
MV pk E vel: 85.4 m/s
PW: 15.8 mm — AB (ref 0.6–1.1)
RV LATERAL S' VELOCITY: 14 cm/s
RV TAPSE: 23.9 mm
TDI e' lateral: 9.46
TDI e' medial: 5.44
WEIGHTICAEL: 3322.77 [oz_av]

## 2016-05-15 LAB — SPIROMETRY WITH GRAPH
FEF 25-75 POST: 1.85 L/s
FEF 25-75 Pre: 1.25 L/sec
FEF2575-%CHANGE-POST: 48 %
FEF2575-%Pred-Post: 63 %
FEF2575-%Pred-Pre: 42 %
FEV1-%CHANGE-POST: 10 %
FEV1-%PRED-PRE: 60 %
FEV1-%Pred-Post: 66 %
FEV1-Post: 2.09 L
FEV1-Pre: 1.88 L
FEV1FVC-%CHANGE-POST: 0 %
FEV1FVC-%PRED-PRE: 90 %
FEV6-%Change-Post: 12 %
FEV6-%PRED-PRE: 68 %
FEV6-%Pred-Post: 76 %
FEV6-POST: 2.97 L
FEV6-Pre: 2.65 L
FEV6FVC-%Change-Post: 0 %
FEV6FVC-%PRED-POST: 104 %
FEV6FVC-%Pred-Pre: 103 %
FVC-%Change-Post: 11 %
FVC-%PRED-POST: 73 %
FVC-%PRED-PRE: 66 %
FVC-POST: 2.97 L
FVC-PRE: 2.67 L
PRE FEV6/FVC RATIO: 99 %
Post FEV1/FVC ratio: 70 %
Post FEV6/FVC ratio: 100 %
Pre FEV1/FVC ratio: 70 %

## 2016-05-15 LAB — LIPID PANEL
Cholesterol: 301 mg/dL — ABNORMAL HIGH (ref 0–200)
HDL: 45 mg/dL (ref >40)
LDL CALC: 240 mg/dL — AB (ref 0–99)
Total CHOL/HDL Ratio: 6.7 RATIO
Triglycerides: 79 mg/dL (ref <150)
VLDL: 16 mg/dL (ref 0–40)

## 2016-05-15 LAB — CBC
HCT: 45.1 % (ref 39.0–52.0)
HEMOGLOBIN: 14.7 g/dL (ref 13.0–17.0)
MCH: 25.2 pg — ABNORMAL LOW (ref 26.0–34.0)
MCHC: 32.6 g/dL (ref 30.0–36.0)
MCV: 77.4 fL — ABNORMAL LOW (ref 78.0–100.0)
PLATELETS: 190 10*3/uL (ref 150–400)
RBC: 5.83 MIL/uL — ABNORMAL HIGH (ref 4.22–5.81)
RDW: 16 % — AB (ref 11.5–15.5)
WBC: 7.7 10*3/uL (ref 4.0–10.5)

## 2016-05-15 LAB — BASIC METABOLIC PANEL
ANION GAP: 7 (ref 5–15)
BUN: 11 mg/dL (ref 6–20)
CALCIUM: 9.1 mg/dL (ref 8.9–10.3)
CO2: 25 mmol/L (ref 22–32)
Chloride: 104 mmol/L (ref 101–111)
Creatinine, Ser: 0.98 mg/dL (ref 0.61–1.24)
GLUCOSE: 147 mg/dL — AB (ref 65–99)
POTASSIUM: 3.8 mmol/L (ref 3.5–5.1)
SODIUM: 136 mmol/L (ref 135–145)

## 2016-05-15 LAB — TROPONIN I
TROPONIN I: 0.18 ng/mL — AB (ref <0.03)
Troponin I: 0.21 ng/mL (ref <0.03)

## 2016-05-15 LAB — HEPARIN LEVEL (UNFRACTIONATED)
HEPARIN UNFRACTIONATED: 0.33 [IU]/mL (ref 0.30–0.70)
Heparin Unfractionated: 0.42 IU/mL (ref 0.30–0.70)

## 2016-05-15 LAB — TSH: TSH: 0.9 u[IU]/mL (ref 0.350–4.500)

## 2016-05-15 MED ORDER — IPRATROPIUM-ALBUTEROL 0.5-2.5 (3) MG/3ML IN SOLN
3.0000 mL | Freq: Four times a day (QID) | RESPIRATORY_TRACT | Status: DC
  Administered 2016-05-15: 3 mL via RESPIRATORY_TRACT
  Filled 2016-05-15: qty 3

## 2016-05-15 MED ORDER — ALBUTEROL SULFATE (2.5 MG/3ML) 0.083% IN NEBU
2.5000 mg | INHALATION_SOLUTION | Freq: Once | RESPIRATORY_TRACT | Status: AC
  Administered 2016-05-15: 2.5 mg via RESPIRATORY_TRACT

## 2016-05-15 MED ORDER — LOSARTAN POTASSIUM 50 MG PO TABS
50.0000 mg | ORAL_TABLET | Freq: Once | ORAL | Status: AC
  Administered 2016-05-15: 50 mg via ORAL
  Filled 2016-05-15: qty 1

## 2016-05-15 MED ORDER — LOSARTAN POTASSIUM 50 MG PO TABS
100.0000 mg | ORAL_TABLET | Freq: Every day | ORAL | Status: DC
  Administered 2016-05-16 – 2016-05-17 (×2): 100 mg via ORAL
  Filled 2016-05-15 (×2): qty 2

## 2016-05-15 NOTE — Progress Notes (Signed)
Pts BP in the 180's, PA notified, new orders given.  Will carry out orders and continue to monitor.

## 2016-05-15 NOTE — Progress Notes (Signed)
ANTICOAGULATION CONSULT NOTE - Follow Up Consult  Pharmacy Consult for heparin Indication: chest pain/ACS  Labs:  Recent Labs  05/14/16 1733 05/14/16 1744 05/14/16 2230 05/15/16 0045  HGB 15.7 17.7*  --   --   HCT 48.3 52.0  --   --   PLT 235  --   --   --   HEPARINUNFRC  --   --   --  0.33  CREATININE 1.28* 1.20  --   --   TROPONINI  --   --  0.21*  --      Assessment: 60yo male therapeutic on heparin with initial dosing for CP though at low end of goal.  Goal of Therapy:  Heparin level 0.3-0.7 units/ml   Plan: Will increase heparin gtt slightly to 1400 units/hr and check level in 6hr.  Vernard GamblesVeronda Muriel Hannold, PharmD, BCPS  05/15/2016,1:14 AM

## 2016-05-15 NOTE — Progress Notes (Signed)
Pt. With critical troponin value of 0.21. On call for cardiology paged. Pt. Stable. No c/o pain. RN will continue to monitor. Armand Preast, Cheryll DessertKaren Cherrell

## 2016-05-15 NOTE — Progress Notes (Signed)
  Echocardiogram 2D Echocardiogram has been performed.  Delcie RochENNINGTON, Haivyn Oravec 05/15/2016, 5:06 PM

## 2016-05-15 NOTE — Progress Notes (Signed)
Patient ID: Frank BostonJerry Tenaglia, male   DOB: 04-08-1956, 60 y.o.   MRN: [id number]    Subjective:  No chest pain dyspnea improved Describes being sick with cough and URI from April to June And having wheezing since then Still smoking   Objective:  Filed Vitals:   05/14/16 2212 05/14/16 2334 05/15/16 0404 05/15/16 0856  BP: 187/94 184/98 175/86 184/87  Pulse: 78   86  Temp: 97.9 F (36.6 C) 98 F (36.7 C) 97.7 F (36.5 C) 98.6 F (37 C)  TempSrc: Oral Oral Oral Oral  Resp: 20 19 17 18   Height: 5\' 10"  (1.778 m)     Weight: 207 lb 12.8 oz (94.257 kg)  207 lb 10.8 oz (94.2 kg)   SpO2: 95% 94%  98%    Intake/Output from previous day:  Intake/Output Summary (Last 24 hours) at 05/15/16 [id number] Last data filed at 05/15/16 [id number]  Gross per 24 hour  Intake    580 ml  Output    525 ml  Net     55 ml    Physical Exam: Affect appropriate Overweight black male  HEENT: normal Neck supple with no adenopathy JVP normal no bruits no thyromegaly Lungs bilateral exp wheezing and good diaphragmatic motion Heart:  S1/S2 no murmur, no rub, gallop or click PMI normal Abdomen: benighn, BS positve, no tenderness, no AAA no bruit.  No HSM or HJR Distal pulses intact with no bruits No edema Neuro non-focal Skin warm and dry No muscular weakness   Lab Results: Basic Metabolic Panel:  Recent Labs  47/82/9506/29/17 1733 05/14/16 1744 05/15/16 0423  NA 138 142 136  K 3.8 3.7 3.8  CL 106 105 104  CO2 25  --  25  GLUCOSE 120* 121* 147*  BUN 13 16 11   CREATININE 1.28* 1.20 0.98  CALCIUM 9.1  --  9.1   CBC:  Recent Labs  05/14/16 1733 05/14/16 1744 05/15/16 0423  WBC 9.3  --  7.7  NEUTROABS 6.8  --   --   HGB 15.7 17.7* 14.7  HCT 48.3 52.0 45.1  MCV 78.3  --  77.4*  PLT 235  --  190   Cardiac Enzymes:  Recent Labs  05/14/16 2230 05/15/16 0423  TROPONINI 0.21* 0.21*   Fasting Lipid Panel:  Recent Labs  05/15/16 0423  CHOL 301*  HDL 45  LDLCALC 240*  TRIG 79  CHOLHDL 6.7    Thyroid Function Tests:  Recent Labs  05/15/16 0423  TSH 0.900    Imaging: Dg Chest 2 View  05/14/2016  CLINICAL DATA:  Shortness of breath.  History of cardiomyopathy. EXAM: CHEST  2 VIEW COMPARISON:  March 01, 2010 FINDINGS: The heart size is borderline with no gross cardiomegaly. The hila and mediastinum are normal. Bilateral, right greater than left, increased opacities which are largely interstitial but a little more focal in the medial right lung base. No nodules or masses. No other acute abnormalities. IMPRESSION: Bilateral pulmonary opacities, right greater than left, could represent developing asymmetric edema, especially given the history of cardiomyopathy. However, the heart is not particularly enlarged and atypical infections should also be entertained. Electronically Signed   By: Gerome Samavid  Williams III M.D   On: 05/14/2016 17:57   Ct Angio Chest Pe W Or Wo Contrast  05/14/2016  CLINICAL DATA:  Shortness of breath for 1 month. EXAM: CT ANGIOGRAPHY CHEST WITH CONTRAST TECHNIQUE: Multidetector CT imaging of the chest was performed using the standard protocol during bolus administration of intravenous contrast.  Multiplanar CT image reconstructions and MIPs were obtained to evaluate the vascular anatomy. CONTRAST:  100 cc Isovue 370 intravenously. COMPARISON:  Chest radiograph 05/14/2016 FINDINGS: Mediastinum/Lymph Nodes: No pulmonary emboli or thoracic aortic dissection identified. No masses or pathologically enlarged lymph nodes identified. The heart is mildly enlarged. There is no pericardial effusion. Mild calcific atherosclerotic disease of the coronary arteries is noted. Lungs/Pleura: Mild upper lobe predominant centrilobular emphysema. There is mixed pattern of alveolar and interstitial pulmonary edema. No evidence of pleural effusions. The lung parenchyma is suboptimally evaluated due to breathing motion artifact. Upper abdomen: No acute findings. Musculoskeletal: No chest wall mass or  suspicious bone lesions identified. Review of the MIP images confirms the above findings. IMPRESSION: Mildly enlarged heart with evidence of calcific coronary artery disease. Interstitial and alveolar pulmonary edema without evidence of pleural effusions. No evidence of pulmonary emboli. Electronically Signed   By: Ted Mcalpineobrinka  Dimitrova M.D.   On: 05/14/2016 20:42    Cardiac Studies:  ECG:  SR LVH with strain    Telemetry:  NSR 05/15/2016   Echo:   Medications:   . amLODipine  10 mg Oral Daily  . aspirin  81 mg Oral Daily  . atorvastatin  80 mg Oral q1800  . furosemide  40 mg Intravenous BID  . hydrALAZINE  50 mg Oral Q8H  . losartan  50 mg Oral Daily     . heparin 1,400 Units/hr (05/15/16 0250)    Assessment/Plan:  CAD:  Flat troponin curve no chest pain now RCA stent 2011 tentatively plan exercise myovue in am Dyspnea:  History of EF 35% echo pending meds adjusted by Dr Anne FuSkains on admission. Component Of COPD with erythrocytosis and wheezing since viral illness in April At nebs , PFTls  HTN:  Increase losartan Chol:  On statin   Charlton Hawseter Alroy Portela 05/15/2016, 9:28 AM

## 2016-05-15 NOTE — Progress Notes (Signed)
ANTICOAGULATION CONSULT NOTE  Pharmacy Consult for Heparin Indication: chest pain/ACS  No Known Allergies  Patient Measurements: Height: 5\' 10"  (177.8 cm) Weight: 207 lb 10.8 oz (94.2 kg) IBW/kg (Calculated) : 73 Heparin Dosing Weight: 93 kg  Vital Signs: Temp: 98.5 F (36.9 C) (06/30 1222) Temp Source: Oral (06/30 1222) BP: 180/95 mmHg (06/30 1222) Pulse Rate: 76 (06/30 1222)  Labs:  Recent Labs  05/14/16 1733 05/14/16 1744 05/14/16 2230 05/15/16 0045 05/15/16 0423 05/15/16 1141  HGB 15.7 17.7*  --   --  14.7  --   HCT 48.3 52.0  --   --  45.1  --   PLT 235  --   --   --  190  --   HEPARINUNFRC  --   --   --  0.33  --  0.42  CREATININE 1.28* 1.20  --   --  0.98  --   TROPONINI  --   --  0.21*  --  0.21* 0.18*    Estimated Creatinine Clearance: 92.4 mL/min (by C-G formula based on Cr of 0.98).   Medical History: Past Medical History  Diagnosis Date  . Hypertension   . Hyperlipidemia   . Epistaxis, recurrent     "related to blood thinners; didn't last for long" (05/14/2016)  . Fatigue     beta blocker d/c'd  . Syncope     negative EP study 02/2010  . Cardiomyopathy, ischemic     EF 35% in 02/2010 prior to PCI;  echo 07/2010: Moderate LVH, EF 45-50%, mild posterior HK, severe inferior HK, grade 1 diastolic dysfunction, mild MR, mild LAE  . Coronary artery disease     nuclear 2011 - small inferolateral infarct with significant peri-infarct ischemia and ejection fraction of 36%.  LHC 03/06/10: Serial proximal, mid and distal LAD 40%, apical LAD 60%, dCFX serial 70%, OM2 with chronic occlusion, small oOM3 80%, pRCA 40%, mRCA 80%, dRCA 80%, PDA serial 70%, EF 35%.  PCI: Overlapping BMS x 2 (2.75 x 32 mm Veriflex and 2.75 x 16 mm Veriflex) to RCA  . Myocardial infarction, inferior wall (HCC) 02/2010    post RCA stenting 03/06/2010     Assessment: 60yo male with history of HTN, HLD ICM and CAD presents with SOB. Pharmacy is consulted to dose heparin for ACS/chest pain.  Pt has not been taking any meds PTA since December 2016 d/t cost. Pt has had epistaxis on anticoagulant in the past. Not on an anticoagulant PTA. No bleeding noted. CBC wnl, trending down.  Goal of Therapy:  Heparin level 0.3-0.7 units/ml Monitor platelets by anticoagulation protocol: Yes   Plan:  Continue heparin infusion at 1400 units/hr Check anti-Xa level daily while on heparin Continue to monitor H&H and platelets   Thank you for allowing us to participate in this patients care. Signe Coltonya C Dannica Bickham, PharmD Pager: [phone]  05/15/2016,1:09 PM

## 2016-05-16 ENCOUNTER — Other Ambulatory Visit: Payer: Self-pay

## 2016-05-16 ENCOUNTER — Observation Stay (HOSPITAL_COMMUNITY): Payer: BLUE CROSS/BLUE SHIELD

## 2016-05-16 DIAGNOSIS — I5021 Acute systolic (congestive) heart failure: Secondary | ICD-10-CM | POA: Diagnosis not present

## 2016-05-16 DIAGNOSIS — I10 Essential (primary) hypertension: Secondary | ICD-10-CM

## 2016-05-16 DIAGNOSIS — E669 Obesity, unspecified: Secondary | ICD-10-CM | POA: Diagnosis not present

## 2016-05-16 DIAGNOSIS — R079 Chest pain, unspecified: Secondary | ICD-10-CM

## 2016-05-16 DIAGNOSIS — I110 Hypertensive heart disease with heart failure: Secondary | ICD-10-CM | POA: Diagnosis not present

## 2016-05-16 DIAGNOSIS — E7801 Familial hypercholesterolemia: Secondary | ICD-10-CM

## 2016-05-16 DIAGNOSIS — I5043 Acute on chronic combined systolic (congestive) and diastolic (congestive) heart failure: Secondary | ICD-10-CM | POA: Diagnosis not present

## 2016-05-16 DIAGNOSIS — I255 Ischemic cardiomyopathy: Secondary | ICD-10-CM

## 2016-05-16 LAB — NM MYOCAR MULTI W/SPECT W/WALL MOTION / EF
CSEPED: 0 min
Estimated workload: 1 METS
Exercise duration (sec): 0 s
MPHR: 160 {beats}/min
Peak HR: 105 {beats}/min
Percent HR: 65 %
RPE: 0
Rest HR: 67 {beats}/min

## 2016-05-16 LAB — CBC
HEMATOCRIT: 47.7 % (ref 39.0–52.0)
HEMOGLOBIN: 15.1 g/dL (ref 13.0–17.0)
MCH: 25.2 pg — ABNORMAL LOW (ref 26.0–34.0)
MCHC: 31.7 g/dL (ref 30.0–36.0)
MCV: 79.5 fL (ref 78.0–100.0)
PLATELETS: 219 10*3/uL (ref 150–400)
RBC: 6 MIL/uL — AB (ref 4.22–5.81)
RDW: 16.1 % — AB (ref 11.5–15.5)
WBC: 12.9 10*3/uL — ABNORMAL HIGH (ref 4.0–10.5)

## 2016-05-16 LAB — HEPARIN LEVEL (UNFRACTIONATED): HEPARIN UNFRACTIONATED: 0.32 [IU]/mL (ref 0.30–0.70)

## 2016-05-16 MED ORDER — REGADENOSON 0.4 MG/5ML IV SOLN
0.4000 mg | Freq: Once | INTRAVENOUS | Status: AC
  Administered 2016-05-16: 0.4 mg via INTRAVENOUS

## 2016-05-16 MED ORDER — HYDRALAZINE HCL 50 MG PO TABS
75.0000 mg | ORAL_TABLET | Freq: Three times a day (TID) | ORAL | Status: DC
  Administered 2016-05-16 – 2016-05-17 (×3): 75 mg via ORAL
  Filled 2016-05-16 (×3): qty 1

## 2016-05-16 MED ORDER — REGADENOSON 0.4 MG/5ML IV SOLN
INTRAVENOUS | Status: AC
  Filled 2016-05-16: qty 5

## 2016-05-16 MED ORDER — IPRATROPIUM-ALBUTEROL 0.5-2.5 (3) MG/3ML IN SOLN
3.0000 mL | Freq: Three times a day (TID) | RESPIRATORY_TRACT | Status: DC
  Administered 2016-05-16 (×3): 3 mL via RESPIRATORY_TRACT
  Filled 2016-05-16 (×4): qty 3

## 2016-05-16 MED ORDER — TECHNETIUM TC 99M TETROFOSMIN IV KIT
30.0000 | PACK | Freq: Once | INTRAVENOUS | Status: AC | PRN
  Administered 2016-05-16: 30 via INTRAVENOUS

## 2016-05-16 MED ORDER — HYDRALAZINE HCL 25 MG PO TABS
25.0000 mg | ORAL_TABLET | Freq: Three times a day (TID) | ORAL | Status: DC
Start: 2016-05-16 — End: 2016-05-16

## 2016-05-16 MED ORDER — TECHNETIUM TC 99M TETROFOSMIN IV KIT
10.0000 | PACK | Freq: Once | INTRAVENOUS | Status: AC | PRN
  Administered 2016-05-16: 10 via INTRAVENOUS

## 2016-05-16 NOTE — Progress Notes (Signed)
lexiscan myoview performed due to abnormal EKG and HTN.

## 2016-05-16 NOTE — Progress Notes (Signed)
Patient ID: Frank Webb, male   DOB: 1956/08/16, 60 y.o.   MRN: [id number]    Subjective:  Breathing better. Stress test today shows no reversible ischemia. There is a fixed inferolateral wall defect consistent with scar. LVEF is 44%. Diuresed 2L negative since admission. Blood pressure remains uncontrolled.  Objective:  Filed Vitals:   05/16/16 0953 05/16/16 0955 05/16/16 0957 05/16/16 1207  BP: 178/92 179/95 193/95 151/85  Pulse: 89 93 85 65  Temp:    98.1 F (36.7 C)  TempSrc:    Oral  Resp:    20  Height:      Weight:      SpO2:    98%    Intake/Output from previous day:  Intake/Output Summary (Last 24 hours) at 05/16/16 1327 Last data filed at 05/16/16 0957  Gross per 24 hour  Intake 968.45 ml  Output   2075 ml  Net -1106.55 ml    Physical Exam: Affect appropriate Overweight black male  HEENT: normal Neck supple with no adenopathy JVP normal no bruits no thyromegaly Lungs bilateral exp wheezing and good diaphragmatic motion Heart:  S1/S2 no murmur, no rub, gallop or click PMI normal Abdomen: benighn, BS positve, no tenderness, no AAA no bruit.  No HSM or HJR Distal pulses intact with no bruits No edema Neuro non-focal Skin warm and dry No muscular weakness   Lab Results: Basic Metabolic Panel:  Recent Labs  84/13/2406/29/17 1733 05/14/16 1744 05/15/16 0423  NA 138 142 136  K 3.8 3.7 3.8  CL 106 105 104  CO2 25  --  25  GLUCOSE 120* 121* 147*  BUN 13 16 11   CREATININE 1.28* 1.20 0.98  CALCIUM 9.1  --  9.1   CBC:  Recent Labs  05/14/16 1733  05/15/16 0423 05/16/16 0254  WBC 9.3  --  7.7 12.9*  NEUTROABS 6.8  --   --   --   HGB 15.7  < > 14.7 15.1  HCT 48.3  < > 45.1 47.7  MCV 78.3  --  77.4* 79.5  PLT 235  --  190 219  < > = values in this interval not displayed. Cardiac Enzymes:  Recent Labs  05/14/16 2230 05/15/16 0423 05/15/16 1141  TROPONINI 0.21* 0.21* 0.18*   Fasting Lipid Panel:  Recent Labs  05/15/16 0423  CHOL 301*  HDL  45  LDLCALC 240*  TRIG 79  CHOLHDL 6.7   Thyroid Function Tests:  Recent Labs  05/15/16 0423  TSH 0.900    Imaging: Dg Chest 2 View  05/14/2016  CLINICAL DATA:  Shortness of breath.  History of cardiomyopathy. EXAM: CHEST  2 VIEW COMPARISON:  March 01, 2010 FINDINGS: The heart size is borderline with no gross cardiomegaly. The hila and mediastinum are normal. Bilateral, right greater than left, increased opacities which are largely interstitial but a little more focal in the medial right lung base. No nodules or masses. No other acute abnormalities. IMPRESSION: Bilateral pulmonary opacities, right greater than left, could represent developing asymmetric edema, especially given the history of cardiomyopathy. However, the heart is not particularly enlarged and atypical infections should also be entertained. Electronically Signed   By: Gerome Samavid  Williams III M.D   On: 05/14/2016 17:57   Ct Angio Chest Pe W Or Wo Contrast  05/14/2016  CLINICAL DATA:  Shortness of breath for 1 month. EXAM: CT ANGIOGRAPHY CHEST WITH CONTRAST TECHNIQUE: Multidetector CT imaging of the chest was performed using the standard protocol during bolus administration of intravenous  contrast. Multiplanar CT image reconstructions and MIPs were obtained to evaluate the vascular anatomy. CONTRAST:  100 cc Isovue 370 intravenously. COMPARISON:  Chest radiograph 05/14/2016 FINDINGS: Mediastinum/Lymph Nodes: No pulmonary emboli or thoracic aortic dissection identified. No masses or pathologically enlarged lymph nodes identified. The heart is mildly enlarged. There is no pericardial effusion. Mild calcific atherosclerotic disease of the coronary arteries is noted. Lungs/Pleura: Mild upper lobe predominant centrilobular emphysema. There is mixed pattern of alveolar and interstitial pulmonary edema. No evidence of pleural effusions. The lung parenchyma is suboptimally evaluated due to breathing motion artifact. Upper abdomen: No acute  findings. Musculoskeletal: No chest wall mass or suspicious bone lesions identified. Review of the MIP images confirms the above findings. IMPRESSION: Mildly enlarged heart with evidence of calcific coronary artery disease. Interstitial and alveolar pulmonary edema without evidence of pleural effusions. No evidence of pulmonary emboli. Electronically Signed   By: Ted Mcalpineobrinka  Dimitrova M.D.   On: 05/14/2016 20:42   Nm Myocar Multi W/spect W/wall Motion / Ef  05/16/2016  CLINICAL DATA:  Chest pain and shortness of breath. Previous myocardial infarct. EXAM: MYOCARDIAL IMAGING WITH SPECT (REST AND PHARMACOLOGIC-STRESS) GATED LEFT VENTRICULAR WALL MOTION STUDY LEFT VENTRICULAR EJECTION FRACTION TECHNIQUE: Standard myocardial SPECT imaging was performed after resting intravenous injection of 10 mCi Tc-6232m tetrofosmin. Subsequently, intravenous infusion of Lexiscan was performed under the supervision of the Cardiology staff. At peak effect of the drug, 30 mCi Tc-7232m tetrofosmin was injected intravenously and standard myocardial SPECT imaging was performed. Quantitative gated imaging was also performed to evaluate left ventricular wall motion, and estimate left ventricular ejection fraction. COMPARISON:  03/05/2010 FINDINGS: Perfusion: A moderate to large area of decreased activity is seen in the inferior and lateral walls of the left ventricle on both stress and resting images, which shows no evidence of reversibility. No reversible perfusion defects seen to suggest reversible ischemia. Wall Motion: Inferior and lateral wall hypokinesis demonstrated. Moderate left ventricular dilation. Left Ventricular Ejection Fraction: 44 % End diastolic volume 190 ml End systolic volume 108 ml IMPRESSION: 1. No evidence of reversible myocardial ischemia. Fixed myocardial perfusion defect in the inferior and lateral walls, consistent with myocardial infarct. 2. Inferior and lateral wall hypokinesis, with moderate left ventricular  dilatation. 3. Left ventricular ejection fraction 44% 4. Non invasive risk stratification*: Intermediate *2012 Appropriate Use Criteria for Coronary Revascularization Focused Update: J Am Coll Cardiol. 2012;59(9):857-881. http://content.dementiazones.comonlinejacc.org/article.aspx?articleid=1201161 Electronically Signed   By: Myles RosenthalJohn  Stahl M.D.   On: 05/16/2016 12:52    Cardiac Studies:  ECG:  SR LVH with strain    Telemetry:  NSR 05/16/2016   Echo:   Medications:   . amLODipine  10 mg Oral Daily  . aspirin  81 mg Oral Daily  . atorvastatin  80 mg Oral q1800  . furosemide  40 mg Intravenous BID  . hydrALAZINE  50 mg Oral Q8H  . ipratropium-albuterol  3 mL Nebulization TID  . losartan  100 mg Oral Daily  . regadenoson         . heparin 1,400 Units/hr (05/15/16 1033)    Assessment/Plan:  1. CAD:  Flat troponin curve no chest pain now RCA stent 2011. Myoview demonstrates mostly fixed inferolateral wall defect suggestive of scar. No anginal complaints at this time. Ok to stop heparin. 2. Dyspnea:  History of EF 35% echo pending meds adjusted by Dr Anne FuSkains on admission. Component of COPD with erythrocytosis and wheezing since viral illness in April At [facility] , PFT's  3. HTN:  Remains poorly controlled on  losartan 100 mg and amlodipine 10 mg daily- increase hydralazine to 75 mg po TID. (history of fatigue on b-blocker) 4. 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 On lipitor 80 mg - good candidate for Repatha as an outpatient.  Keep until tomorrow for better blood pressure control.  Chrystie Nose, MD, [facility] Attending Cardiologist  [facility]  Lisette Abu Hilty 05/16/2016, 1:27 PM

## 2016-05-17 ENCOUNTER — Other Ambulatory Visit: Payer: Self-pay

## 2016-05-17 ENCOUNTER — Encounter (HOSPITAL_COMMUNITY): Payer: Self-pay | Admitting: Nurse Practitioner

## 2016-05-17 DIAGNOSIS — I5021 Acute systolic (congestive) heart failure: Secondary | ICD-10-CM | POA: Diagnosis not present

## 2016-05-17 DIAGNOSIS — I160 Hypertensive urgency: Secondary | ICD-10-CM

## 2016-05-17 DIAGNOSIS — E669 Obesity, unspecified: Secondary | ICD-10-CM | POA: Diagnosis not present

## 2016-05-17 DIAGNOSIS — E7801 Familial hypercholesterolemia: Secondary | ICD-10-CM | POA: Diagnosis not present

## 2016-05-17 DIAGNOSIS — R7989 Other specified abnormal findings of blood chemistry: Secondary | ICD-10-CM

## 2016-05-17 DIAGNOSIS — R778 Other specified abnormalities of plasma proteins: Secondary | ICD-10-CM

## 2016-05-17 DIAGNOSIS — I5043 Acute on chronic combined systolic (congestive) and diastolic (congestive) heart failure: Secondary | ICD-10-CM

## 2016-05-17 DIAGNOSIS — I110 Hypertensive heart disease with heart failure: Secondary | ICD-10-CM | POA: Diagnosis not present

## 2016-05-17 DIAGNOSIS — I255 Ischemic cardiomyopathy: Secondary | ICD-10-CM | POA: Diagnosis not present

## 2016-05-17 LAB — CBC
HEMATOCRIT: 48.6 % (ref 39.0–52.0)
Hemoglobin: 15.7 g/dL (ref 13.0–17.0)
MCH: 25.2 pg — AB (ref 26.0–34.0)
MCHC: 32.3 g/dL (ref 30.0–36.0)
MCV: 77.9 fL — ABNORMAL LOW (ref 78.0–100.0)
Platelets: 212 10*3/uL (ref 150–400)
RBC: 6.24 MIL/uL — ABNORMAL HIGH (ref 4.22–5.81)
RDW: 15.8 % — AB (ref 11.5–15.5)
WBC: 7.7 10*3/uL (ref 4.0–10.5)

## 2016-05-17 MED ORDER — FUROSEMIDE 40 MG PO TABS: 40.0000 mg | ORAL_TABLET | Freq: Two times a day (BID) | ORAL | Status: DC

## 2016-05-17 MED ORDER — ASPIRIN 81 MG PO TABS: 81.0000 mg | ORAL_TABLET | Freq: Every day | ORAL | Status: AC

## 2016-05-17 MED ORDER — IPRATROPIUM-ALBUTEROL 0.5-2.5 (3) MG/3ML IN SOLN: 3.0000 mL | RESPIRATORY_TRACT | Status: DC | PRN

## 2016-05-17 MED ORDER — AMLODIPINE BESYLATE 10 MG PO TABS: 10.0000 mg | ORAL_TABLET | Freq: Every day | ORAL | Status: DC

## 2016-05-17 MED ORDER — HYDRALAZINE HCL 50 MG PO TABS: 75.0000 mg | ORAL_TABLET | Freq: Three times a day (TID) | ORAL | Status: DC

## 2016-05-17 MED ORDER — ATORVASTATIN CALCIUM 80 MG PO TABS: 80.0000 mg | ORAL_TABLET | Freq: Every day | ORAL | Status: DC

## 2016-05-17 MED ORDER — NITROGLYCERIN 0.4 MG SL SUBL: 0.4000 mg | SUBLINGUAL_TABLET | SUBLINGUAL | Status: DC | PRN

## 2016-05-17 MED ORDER — LOSARTAN POTASSIUM 100 MG PO TABS: 100.0000 mg | ORAL_TABLET | Freq: Every day | ORAL | Status: AC

## 2016-05-17 NOTE — Discharge Summary (Signed)
Discharge Summary    Patient ID: Frank Webb,  MRN: [id number], DOB/AGE: [birth year] 60 y.o.  Admit date: 05/14/2016 Discharge date: 05/17/2016  Primary Care Provider: No PCP Per Patient Primary Cardiologist: M. Anne FuSkains, MD  Discharge Diagnoses    Principal Problem:   Acute on chronic combined systolic (congestive) and diastolic (congestive) heart failure (HCC)  **Normal LV function by echocardiogram this admission.  **Neck negative diuresis of 2.17 L with reduction in weight from 207 pounds on admission to 200 pounds on discharge.  Active Problems:   Cardiomyopathy, ischemic  **EF previously as low as 35% - 55 to 60% by echo this admission.   Hypertensive urgency  **Blood pressure 204/132 upon arrival.    Coronary Artery Disease   Tobacco abuse   Obesity   Elevated troponin   HLD (hyperlipidemia)   Allergies No Known Allergies  Diagnostic Studies/Procedures    2D Echocardiogram 6.30.2017  Study Conclusions   - Left ventricle: The cavity size was normal. Wall thickness was   increased in a pattern of mild LVH. Systolic function was normal.   The estimated ejection fraction was in the range of 55% to 60%.   Wall motion was normal; there were no regional wall motion   abnormalities. Features are consistent with a pseudonormal left   ventricular filling pattern, with concomitant abnormal relaxation   and increased filling pressure (grade 2 diastolic dysfunction). - Mitral valve: There was mild regurgitation. _____________  Eugenie BirksLexiscan Cardiolite 7.1.2017  IMPRESSION: 1. No evidence of reversible myocardial ischemia. Fixed myocardial perfusion defect in the inferior and lateral walls, consistent with myocardial infarct. 2. Inferior and lateral wall hypokinesis, with moderate left ventricular dilatation. 3. Left ventricular ejection fraction 44% 4. Non invasive risk stratification*: Intermediate _____________   History of Present Illness     [age] year old male  with a prior history of coronary artery disease status post right coronary artery bare metal stenting 2 in 2011. At that time, his EF was 35%. He also has a history of hypertension, hyperlipidemia, tobacco abuse, and obesity. He has been off all medications since late 2016. He presented to the [facility] on June 29 following a one-month history of progressive dyspnea that became associated with chest discomfort earlier that day. This prompted him to call EMS. Upon their arrival, he was noted to be hypertensive with a blood pressure of 204/132, and a heart rate of 111. He was treated with IV Solu-Medrol and albuterol and taken to the [facility]. There was some concern for anterior ST segment elevation, however ultimately, it was not felt that his presentation represented a STEMI. His troponin was mildly elevated at 0.09 on admission and chest x-ray was concerning for CHF. He was admitted for further evaluation of hypertensive urgency and CHF.   Hospital Course     Consultants: None   Following admission, he was placed on intravenous Lasix with good response. As he had been off of previous antihypertensive, these were resumed including amlodipine, losartan, and hydralazine. With diuresis and subsequent titration of antihypertensive therapy, he did have symptomatic improvement as well as some improvement in blood pressure management. His troponin remained mildly elevated, with a peak of 0.21 and a flat trend. Echocardiogram showed normal LV function with grade 2 diastolic dysfunction and mild mitral regurgitation. In that setting, stress testing was undertaken on July 1, and showed a fixed inferior and lateral defect without evidence of ischemia. It was felt that this was an intermediate risk study and in the absence  of acute ischemia, no further diagnostic evaluation was warranted.  In all, he has diuresed 2.17 L with a reduction in weight from 207 pounds on admission to 200 pounds on discharge. He has been  counseled on the importance of sodium avoidance, medication compliance, and symptom reporting. He has also been counseled on the importance of smoking cessation. He has ambulated without difficulty and will be discharged home today in good condition. We will arrange for follow-up office visit within the next 7 days. _____________  Discharge Vitals Blood pressure 154/84, pulse 75, temperature 98 F (36.7 C), temperature source Oral, resp. rate 16, height  (1.778 m), weight 200 lb 3.2 oz (90.81 kg), SpO2 100 %.  Filed Weights   05/15/16 0404 05/16/16 0708 05/17/16 0652  Weight: 207 lb 10.8 oz (94.2 kg) 202 lb 9.6 oz (91.899 kg) 200 lb 3.2 oz (90.81 kg)    Labs & Radiologic Studies    CBC  Recent Labs  05/14/16 1733  05/16/16 0254 05/17/16 0552  WBC 9.3  < > 12.9* 7.7  NEUTROABS 6.8  --   --   --   HGB 15.7  < > 15.1 15.7  HCT 48.3  < > 47.7 48.6  MCV 78.3  < > 79.5 77.9*  PLT 235  < > 219 212  < > = values in this interval not displayed. Basic Metabolic Panel  Recent Labs  05/14/16 1733 05/14/16 1744 05/15/16 0423  NA 138 142 136  K 3.8 3.7 3.8  CL 106 105 104  CO2 25  --  25  GLUCOSE 120* 121* 147*  BUN CREATININE 1.28* 1.20 0.98  CALCIUM 9.1  --  9.1   Cardiac Enzymes  Recent Labs  05/14/16 2230 05/15/16 0423 05/15/16 1141  TROPONINI 0.21* 0.21* 0.18*   Fasting Lipid Panel  Recent Labs  05/15/16 0423  CHOL 301*  HDL 45  LDLCALC 240*  TRIG 79  CHOLHDL 6.7   Thyroid Function Tests  Recent Labs  05/15/16 0423  TSH 0.900   _____________  Dg Chest 2 View  05/14/2016  CLINICAL DATA:  Shortness of breath.  History of cardiomyopathy. EXAM: CHEST  2 VIEW COMPARISON:  March 01, 2010 FINDINGS: The heart size is borderline with no gross cardiomegaly. The hila and mediastinum are normal. Bilateral, right greater than left, increased opacities which are largely interstitial but a little more focal in the medial right lung base. No nodules or  masses. No other acute abnormalities. IMPRESSION: Bilateral pulmonary opacities, right greater than left, could represent developing asymmetric edema, especially given the history of cardiomyopathy. However, the heart is not particularly enlarged and atypical infections should also be entertained. Electronically Signed   By: Gerome Sam III M.D   On: 05/14/2016 17:57   Ct Angio Chest Pe W Or Wo Contrast  05/14/2016  CLINICAL DATA:  Shortness of breath for 1 month. EXAM: CT ANGIOGRAPHY CHEST WITH CONTRAST TECHNIQUE: Multidetector CT imaging of the chest was performed using the standard protocol during bolus administration of intravenous contrast. Multiplanar CT image reconstructions and MIPs were obtained to evaluate the vascular anatomy. CONTRAST:  100 cc Isovue 370 intravenously. COMPARISON:  Chest radiograph 05/14/2016 FINDINGS: Mediastinum/Lymph Nodes: No pulmonary emboli or thoracic aortic dissection identified. No masses or pathologically enlarged lymph nodes identified. The heart is mildly enlarged. There is no pericardial effusion. Mild calcific atherosclerotic disease of the coronary arteries is noted. Lungs/Pleura: Mild upper lobe predominant centrilobular emphysema. There is mixed pattern of  alveolar and interstitial pulmonary edema. No evidence of pleural effusions. The lung parenchyma is suboptimally evaluated due to breathing motion artifact. Upper abdomen: No acute findings. Musculoskeletal: No chest wall mass or suspicious bone lesions identified. Review of the MIP images confirms the above findings. IMPRESSION: Mildly enlarged heart with evidence of calcific coronary artery disease. Interstitial and alveolar pulmonary edema without evidence of pleural effusions. No evidence of pulmonary emboli. Electronically Signed   By: Ted Mcalpineobrinka  Dimitrova M.D.   On: 05/14/2016 20:42   Nm Myocar Multi W/spect W/wall Motion / Ef  05/16/2016  CLINICAL DATA:  Chest pain and shortness of breath. Previous  myocardial infarct. EXAM: MYOCARDIAL IMAGING WITH SPECT (REST AND PHARMACOLOGIC-STRESS) GATED LEFT VENTRICULAR WALL MOTION STUDY LEFT VENTRICULAR EJECTION FRACTION TECHNIQUE: Standard myocardial SPECT imaging was performed after resting intravenous injection of 10 mCi Tc-8176m tetrofosmin. Subsequently, intravenous infusion of Lexiscan was performed under the supervision of the Cardiology staff. At peak effect of the drug, 30 mCi Tc-8176m tetrofosmin was injected intravenously and standard myocardial SPECT imaging was performed. Quantitative gated imaging was also performed to evaluate left ventricular wall motion, and estimate left ventricular ejection fraction. COMPARISON:  03/05/2010 FINDINGS: Perfusion: A moderate to large area of decreased activity is seen in the inferior and lateral walls of the left ventricle on both stress and resting images, which shows no evidence of reversibility. No reversible perfusion defects seen to suggest reversible ischemia. Wall Motion: Inferior and lateral wall hypokinesis demonstrated. Moderate left ventricular dilation. Left Ventricular Ejection Fraction: 44 % End diastolic volume 190 ml End systolic volume 108 ml IMPRESSION: 1. No evidence of reversible myocardial ischemia. Fixed myocardial perfusion defect in the inferior and lateral walls, consistent with myocardial infarct. 2. Inferior and lateral wall hypokinesis, with moderate left ventricular dilatation. 3. Left ventricular ejection fraction 44% 4. Non invasive risk stratification*: Intermediate *2012 Appropriate Use Criteria for Coronary Revascularization Focused Update: J Am Coll Cardiol. 2012;59(9):857-881. http://content.dementiazones.comonlinejacc.org/article.aspx?articleid=1201161 Electronically Signed   By: Myles RosenthalJohn  Stahl M.D.   On: 05/16/2016 12:52   Disposition   Pt is being discharged home today in good condition.  Follow-up Plans & Appointments    Follow-up Information    Follow up with Donato SchultzMark Skains, MD In 1 week.    Specialty:  Cardiology   Why:  we will arrange for follow-up and contact you.   Contact information:   [address] [place] [address] [phone]        Discharge Medications   Current Discharge Medication List    START taking these medications   Details  atorvastatin (LIPITOR) 80 MG tablet Take 1 tablet (80 mg total) by mouth daily at 6 PM. Qty: 30 tablet, Refills: 6    furosemide (LASIX) 40 MG tablet Take 1 tablet (40 mg total) by mouth 2 (two) times daily. Qty: 30 tablet, Refills: 3    losartan (COZAAR) 100 MG tablet Take 1 tablet (100 mg total) by mouth daily. Qty: 30 tablet, Refills: 6    nitroGLYCERIN (NITROSTAT) 0.4 MG SL tablet Place 1 tablet (0.4 mg total) under the tongue every 5 (five) minutes as needed for chest pain (CP or SOB). Qty: 25 tablet, Refills: 3      CONTINUE these medications which have CHANGED   Details  amLODipine (NORVASC) 10 MG tablet Take 1 tablet (10 mg total) by mouth daily. Qty: 30 tablet, Refills: 6    aspirin 81 MG tablet Take 1 tablet (81 mg total) by mouth daily. Qty: 30 tablet  Associated Diagnoses: Dysthymic disorder; Coronary atherosclerosis of native coronary artery    hydrALAZINE (APRESOLINE) 50 MG tablet Take 1.5 tablets (75 mg total) by mouth 3 (three) times daily. Qty: 135 tablet, Refills: 3      STOP taking these medications     Chlorphen-Phenyleph-ASA (ALKA-SELTZER PLUS COLD PO)          Aspirin prescribed at discharge?  Yes High Intensity Statin Prescribed? (Lipitor 40-80mg  or Crestor 20-40mg ): Yes Beta Blocker Prescribed? No: Intolerant For EF <40%, was ACEI/ARB Prescribed? No: N/A ADP Receptor Inhibitor Prescribed? (i.e. Plavix etc.-Includes Medically Managed Patients): No: N/A For EF <40%, Aldosterone Inhibitor Prescribed? No: N/A Was EF assessed during THIS hospitalization? Yes Was Cardiac Rehab II ordered? (Included Medically managed Patients): No: N/A   Outstanding Labs/Studies    F/u BMET @ return office visit.  Duration of Discharge Encounter   Greater than 30 minutes including physician time.  Signed, Nicolasa Ducking NP 05/17/2016, 11:50 AM

## 2016-05-17 NOTE — Progress Notes (Signed)
Patient ID: Frank Webb, male   DOB: 12-17-1955, 60 y.o.   MRN: [id number]    Subjective:  Blood pressure is somewhat better this morning. Wants to go home today.  Objective:  Filed Vitals:   05/16/16 2047 05/17/16 0548 05/17/16 0652 05/17/16 1004  BP: 163/88 174/88  154/84  Pulse: 67 74  75  Temp: 98.5 F (36.9 C) 98 F (36.7 C)  98 F (36.7 C)  TempSrc: Oral Oral    Resp: 18 18  16   Height:      Weight:   200 lb 3.2 oz (90.81 kg)   SpO2: 96% 97%  100%    Intake/Output from previous day:  Intake/Output Summary (Last 24 hours) at 05/17/16 1031 Last data filed at 05/17/16 0901  Gross per 24 hour  Intake   1240 ml  Output   1125 ml  Net    115 ml    Physical Exam: Affect appropriate Overweight black male  HEENT: normal Neck supple with no adenopathy JVP normal no bruits no thyromegaly Lungs bilateral exp wheezing and good diaphragmatic motion Heart:  S1/S2 no murmur, no rub, gallop or click PMI normal Abdomen: benighn, BS positve, no tenderness, no AAA no bruit.  No HSM or HJR Distal pulses intact with no bruits No edema Neuro non-focal Skin warm and dry No muscular weakness   Lab Results: Basic Metabolic Panel:  Recent Labs  56/21/3006/29/17 1733 05/14/16 1744 05/15/16 0423  NA 138 142 136  K 3.8 3.7 3.8  CL 106 105 104  CO2 25  --  25  GLUCOSE 120* 121* 147*  BUN 13 16 11   CREATININE 1.28* 1.20 0.98  CALCIUM 9.1  --  9.1   CBC:  Recent Labs  05/14/16 1733  05/16/16 0254 05/17/16 0552  WBC 9.3  < > 12.9* 7.7  NEUTROABS 6.8  --   --   --   HGB 15.7  < > 15.1 15.7  HCT 48.3  < > 47.7 48.6  MCV 78.3  < > 79.5 77.9*  PLT 235  < > 219 212  < > = values in this interval not displayed. Cardiac Enzymes:  Recent Labs  05/14/16 2230 05/15/16 0423 05/15/16 1141  TROPONINI 0.21* 0.21* 0.18*   Fasting Lipid Panel:  Recent Labs  05/15/16 0423  CHOL 301*  HDL 45  LDLCALC 240*  TRIG 79  CHOLHDL 6.7   Thyroid Function Tests:  Recent Labs  05/15/16 0423  TSH 0.900    Imaging: Nm Myocar Multi W/spect W/wall Motion / Ef  05/16/2016  CLINICAL DATA:  Chest pain and shortness of breath. Previous myocardial infarct. EXAM: MYOCARDIAL IMAGING WITH SPECT (REST AND PHARMACOLOGIC-STRESS) GATED LEFT VENTRICULAR WALL MOTION STUDY LEFT VENTRICULAR EJECTION FRACTION TECHNIQUE: Standard myocardial SPECT imaging was performed after resting intravenous injection of 10 mCi Tc-1885m tetrofosmin. Subsequently, intravenous infusion of Lexiscan was performed under the supervision of the Cardiology staff. At peak effect of the drug, 30 mCi Tc-6485m tetrofosmin was injected intravenously and standard myocardial SPECT imaging was performed. Quantitative gated imaging was also performed to evaluate left ventricular wall motion, and estimate left ventricular ejection fraction. COMPARISON:  03/05/2010 FINDINGS: Perfusion: A moderate to large area of decreased activity is seen in the inferior and lateral walls of the left ventricle on both stress and resting images, which shows no evidence of reversibility. No reversible perfusion defects seen to suggest reversible ischemia. Wall Motion: Inferior and lateral wall hypokinesis demonstrated. Moderate left ventricular dilation. Left Ventricular Ejection Fraction:  44 % End diastolic volume 190 ml End systolic volume 108 ml IMPRESSION: 1. No evidence of reversible myocardial ischemia. Fixed myocardial perfusion defect in the inferior and lateral walls, consistent with myocardial infarct. 2. Inferior and lateral wall hypokinesis, with moderate left ventricular dilatation. 3. Left ventricular ejection fraction 44% 4. Non invasive risk stratification*: Intermediate *2012 Appropriate Use Criteria for Coronary Revascularization Focused Update: J Am Coll Cardiol. 2012;59(9):857-881. http://content.dementiazones.comonlinejacc.org/article.aspx?articleid=[email or web address] Electronically Signed   By: Frank RosenthalJohn  Webb M.D.   On: 05/16/2016 12:52    Cardiac Studies:  ECG:   SR LVH with strain    Telemetry:  NSR 05/17/2016   Echo:   Medications:   . amLODipine  10 mg Oral Daily  . aspirin  81 mg Oral Daily  . atorvastatin  80 mg Oral q1800  . furosemide  40 mg Intravenous BID  . hydrALAZINE  75 mg Oral Q8H  . losartan  100 mg Oral Daily        Assessment/Plan:  1. CAD:  Flat troponin curve no chest pain now RCA stent 2011. Myoview demonstrates mostly fixed inferolateral wall defect suggestive of scar. No anginal complaints at this time. Ok to stop heparin. 2. Dyspnea:  History of EF 35% - now 55-60% on echo (44% on myoview). Component of COPD with erythrocytosis and wheezing since viral illness in April At ne[facility] , PFT's  3. HTN:  Remains poorly controlled on losartan 100 mg and amlodipine 10 mg daily- increased hydralazine to 75 mg po TID yesterday. (history of fatigue on b-blocker, therefore not on it) 4. 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 On lipitor 80 mg - good candidate for Repatha as an outpatient.  Ok for d/c home today. Follow-up with Dr. Anne Webb as an outpatient.  Frank NoseKenneth C. Schuyler Olden, MD, [facility] Attending Cardiologist  [facility]  Frank Webb 05/17/2016, 10:31 AM

## 2016-05-17 NOTE — Progress Notes (Signed)
Patient is discharge to home accompanied by  NT via wheelchair. Discharge instructions given . Patient verbalizes understanding. All personal belongings given. Telemetry box and IV removed prior to discharge and site in good condition.  

## 2016-05-17 NOTE — Discharge Instructions (Signed)
***  PLEASE REMEMBER TO BRING ALL OF YOUR MEDICATIONS TO EACH OF YOUR FOLLOW-UP OFFICE VISITS.  

## 2016-05-18 ENCOUNTER — Telehealth: Payer: Self-pay | Admitting: Cardiology

## 2016-05-18 NOTE — Telephone Encounter (Signed)
Left message to call back  

## 2016-05-18 NOTE — Telephone Encounter (Signed)
TOC per Thayer Ohmhris 7/10 w/Lori @ 330; ah

## 2016-05-20 NOTE — Telephone Encounter (Signed)
[facility]  Patient understands to follow up with Norma FredricksonLori Gerhardt, NP on 05/25/16 at [facility]. Patient understands discharge instructions? yes Patient understands medications and regiment? yes Patient understands to bring all medications to this visit? yes  [facility] [address] [facility] the only prescription they had for him was nitroglycerin. Pt states he is taking aspirin 81mg  daily but has not taken any other medication since discharge from the hospital. [facility] and spoke with Gabriel RungJoe. Joe states they have all the discharge prescriptions on hold for pt but were waiting for the patient to call to let them know he needed them filled. Joe states they will go ahead and fill medications,they should be ready for pt to pick up by 11:30AM today. I verified and reviewed discharge medications with pt, he did not have any questions about his medications. Pt is aware prescriptions will be ready for pick up today by 11:30AM, pt states he will get them and start taking them today. Pt denies any symptoms since discharge from hospital, including shortness of breath.  I reviewed with Nada BoozerLaura Ingold, NP--  Per Laura--review importance of taking medications, especially need for good BP control and symptom management, with pt, keep appt with Norma FredricksonLori Gerhardt, NP on 05/25/16.   I spoke with pt and emphasized importance of taking medications regularly for good BP control and symptom management, pt aware of appt 05/25/16 at 3:30PM, pt verbalized understanding.

## 2016-05-25 ENCOUNTER — Other Ambulatory Visit: Payer: Self-pay

## 2016-05-25 ENCOUNTER — Ambulatory Visit (INDEPENDENT_AMBULATORY_CARE_PROVIDER_SITE_OTHER): Payer: BLUE CROSS/BLUE SHIELD | Admitting: Physician Assistant

## 2016-05-25 VITALS — BP 170/100 | HR 88 | Ht 69.0 in | Wt 206.4 lb

## 2016-05-25 DIAGNOSIS — I10 Essential (primary) hypertension: Secondary | ICD-10-CM

## 2016-05-25 DIAGNOSIS — R0683 Snoring: Secondary | ICD-10-CM | POA: Diagnosis not present

## 2016-05-25 MED ORDER — HYDRALAZINE HCL 100 MG PO TABS: 100.0000 mg | ORAL_TABLET | Freq: Three times a day (TID) | ORAL | Status: DC

## 2016-05-25 MED ORDER — SILDENAFIL CITRATE 100 MG PO TABS: 50.0000 mg | ORAL_TABLET | Freq: Every day | ORAL | Status: DC | PRN

## 2016-05-25 NOTE — Addendum Note (Signed)
Addended by: Bartholome BillBHAGAT, Yannely Kintzel M on: 05/25/2016 04:37 PM   Modules accepted: Level of Service

## 2016-05-25 NOTE — Patient Instructions (Signed)
Medication Instructions:  Your physician has recommended you make the following change in your medication:  1. Hydrazaline (100 mg ) three times a day 2. Viagra (100 mg ) take one half tablet 1/2 hour before sexual intercourse.    Labwork: Your physician recommends that you have lab work today: bmet   Testing/Procedures: Your physician has recommended that you have a sleep study. This test records several body functions during sleep, including: brain activity, eye movement, oxygen and carbon dioxide blood levels, heart rate and rhythm, breathing rate and rhythm, the flow of air through your mouth and nose, snoring, body muscle movements, and chest and belly movement.    Follow-Up: Your physician wants you to follow-up in: 3 months with Dr. Anne FuSkains.  You will receive a reminder letter in the mail two months in advance. If you don't receive a letter, please call our office to schedule the follow-up appointment.   Any Other Special Instructions Will Be Listed Below (If Applicable).     If you need a refill on your cardiac medications before your next appointment, please call your pharmacy.

## 2016-05-25 NOTE — Progress Notes (Signed)
Cardiology Office Note    Date:  05/25/2016   ID:  Frank Webb, DOB [birth year], MRN [id number]  PCP:  No PCP Per Patient  Cardiologist:  Dr. Anne Fu  Chief Complaint: Hospital follow up for CHF    History of Present Illness:   Frank Webb is a 60 y.o. male presents for follow up.   Prior history of coronary artery disease s/p BMS to RCA 2 in 2011, hypertension, hyperlipidemia, tobacco abuse, and obesity. He has been off all medications since late 2016. He presented to the [facility] on June 29 following a one-month history of progressive dyspnea that became associated with chest discomfort earlier that day. This prompted him to call EMS. Upon their arrival, he was noted to be hypertensive with a blood pressure of 204/132, and a heart rate of 111. He was treated with IV Solu-Medrol and albuterol and taken to the [facility]. There was some concern for anterior ST segment elevation, however ultimately, it was not felt that his presentation represented a STEMI. His troponin was mildly elevated at 0.09 on admission and chest x-ray was concerning for CHF. He was admitted for further evaluation of hypertensive urgency and CHF.   With diuresis and subsequent titration of antihypertensive therapy, he did have symptomatic improvement as well as some improvement in blood pressure management. His troponin remained mildly elevated, with a peak of 0.21 and a flat trend. Echocardiogram showed normal LV function with grade 2 diastolic dysfunction and mild mitral regurgitation. In that setting, stress testing was undertaken on July 1, and showed a fixed inferior and lateral defect without evidence of ischemia. It was felt that this was an intermediate risk study and in the absence of acute ischemia, no further diagnostic evaluation was warranted. He has diuresed 2.17 L with a reduction in weight from 207 pounds on admission to 200 pounds on discharge.   Since discharges he is feeling well. No chest pain, SOB,  palpitations, LE edema, orthopnea, PND, dizziness or syncope. He has cut back on cigarette smoking. No extra salt. He is not on BB due to fatigue. Endorse to having snoring and partner said that he has stopped breathing at night.    Past Medical History  Diagnosis Date  . Hypertension   . Hyperlipidemia   . Epistaxis, recurrent     "related to blood thinners; didn't last for long" (05/14/2016)  . Fatigue     beta blocker d/c'd  . Syncope     a. negative EP study 02/2010  . Cardiomyopathy, ischemic     a. EF 35% in 02/2010 prior to PCI;  b. echo 07/2010: Moderate LVH, EF 45-50%, mild posterior HK, severe inferior HK, grade 1 diastolic dysfunction, mild MR, mild LAE; c. 04/2016 Echo: EF 55-60%, Gr2 DD, mild MR.  . Coronary artery disease     a. 02/2010 inferior MI -> Cath: Serial proximal, mid and distal LAD 40%, apical LAD 60%, dCFX serial 70%, OM2 with chronic occlusion, small oOM3 80%, pRCA 40%, mRCA 80%, dRCA 80%, PDA serial 70%, EF 35%.  PCI: Overlapping BMS x 2 (2.75 x 32 mm Veriflex and 2.75 x 16 mm Veriflex) to RCA; c. 05/2016 MV: inf/lat infarct w/o ischemia. EF 44%.  . Chronic combined systolic (congestive) and diastolic (congestive) heart failure (HCC)     a. EF prev 35% in 2011-->55-60% 04/2016 echo.  . Noncompliance     Past Surgical History  Procedure Laterality Date  . Coronary angioplasty with stent placement  03/06/2010  drug-eluting stents in the RCA  . Cardiac catheterization  03/10/2010    This study demonstrates no inducible VT or inducible SVT in a patient with ischemic heart disease, mild LV dysfunction (normal echo, 35% by cath with prior stenting to the RCA)  --      Current Medications: Prior to Admission medications   Medication Sig Start Date End Date Taking? Authorizing Provider  amLODipine (NORVASC) 10 MG tablet Take 1 tablet (10 mg total) by mouth daily. 05/17/16   Ok Anis, NP  aspirin 81 MG tablet Take 1 tablet (81 mg total) by mouth daily. 05/17/16    Ok Anis, NP  atorvastatin (LIPITOR) 80 MG tablet Take 1 tablet (80 mg total) by mouth daily at 6 PM. 05/17/16   Ok Anis, NP  furosemide (LASIX) 40 MG tablet Take 1 tablet (40 mg total) by mouth 2 (two) times daily. 05/17/16   Ok Anis, NP  hydrALAZINE (APRESOLINE) 50 MG tablet Take 1.5 tablets (75 mg total) by mouth 3 (three) times daily. 05/17/16   Ok Anis, NP  losartan (COZAAR) 100 MG tablet Take 1 tablet (100 mg total) by mouth daily. 05/17/16   Ok Anis, NP  nitroGLYCERIN (NITROSTAT) 0.4 MG SL tablet Place 1 tablet (0.4 mg total) under the tongue every 5 (five) minutes as needed for chest pain (CP or SOB). 05/17/16   Ok Anis, NP    Allergies:   Review of patient's allergies indicates no known allergies.   Social History   Social History  . Marital Status: Divorced    Spouse Name: N/A  . Number of Children: N/A  . Years of Education: N/A   Social History Main Topics  . Smoking status: Current Every Day Smoker -- 1.00 packs/day for 40 years    Types: Cigarettes  . Smokeless tobacco: Never Used  . Alcohol Use: 6.6 oz/week    11 Shots of liquor per week     Comment: 05/14/2016 "pint of brandy/week"  . Drug Use: No  . Sexual Activity: Yes   Other Topics Concern  . Not on file   Social History Narrative     Family History:  The patient's family history includes Cancer (age of onset: 64) in his mother; Diabetes in his brother and sister; Hypertension in his brother, brother, and sister; Pneumonia (age of onset: 62) in his father.   ROS:   Please see the history of present illness.    ROS All other systems reviewed and are negative.   PHYSICAL EXAM:   VS:  BP 170/100 mmHg  Pulse 88  Ht 5\' 9"  (1.753 m)  Wt 206 lb 6.4 oz (93.622 kg)  BMI 30.47 kg/m2   GEN: Well nourished, well developed, in no acute distress HEENT: normal Neck: no JVD, carotid bruits, or masses Cardiac: RRR; no murmurs, rubs, or gallops,no edema    Respiratory:  clear to auscultation bilaterally, normal work of breathing GI: soft, nontender, nondistended, + BS MS: no deformity or atrophy Skin: warm and dry, no rash Neuro:  Alert and Oriented x 3, Strength and sensation are intact Psych: euthymic mood, full affect  Wt Readings from Last 3 Encounters:  05/25/16 206 lb 6.4 oz (93.622 kg)  05/17/16 200 lb 3.2 oz (90.81 kg)  10/26/13 210 lb (95.255 kg)      Studies/Labs Reviewed:   EKG:  EKG is not ordered today.    Recent Labs: 05/15/2016: BUN 11; Creatinine, Ser 0.98; Potassium 3.8; Sodium 136;  TSH 0.900 05/17/2016: Hemoglobin 15.7; Platelets 212   Lipid Panel    Component Value Date/Time   CHOL 301* 05/15/2016 0423   TRIG 79 05/15/2016 0423   HDL 45 05/15/2016 0423   CHOLHDL 6.7 05/15/2016 0423   VLDL 16 05/15/2016 0423   LDLCALC 240* 05/15/2016 0423    Additional studies/ records that were reviewed today include:   2D Echocardiogram 6.30.2017  Study Conclusions  - Left ventricle: The cavity size was normal. Wall thickness was  increased in a pattern of mild LVH. Systolic function was normal.  The estimated ejection fraction was in the range of 55% to 60%.  Wall motion was normal; there were no regional wall motion  abnormalities. Features are consistent with a pseudonormal left  ventricular filling pattern, with concomitant abnormal relaxation  and increased filling pressure (grade 2 diastolic dysfunction). - Mitral valve: There was mild regurgitation. _____________  Eugenie BirksLexiscan Cardiolite 7.1.2017  IMPRESSION: 1. No evidence of reversible myocardial ischemia. Fixed myocardial perfusion defect in the inferior and lateral walls, consistent with myocardial infarct. 2. Inferior and lateral wall hypokinesis, with moderate left ventricular dilatation. 3. Left ventricular ejection fraction 44% 4. Non invasive risk stratification*: Intermediate    ASSESSMENT & PLAN:    1. Chronic systolic and  diastolic CHF - Echo recent admission showed normal Lv function to 55-60% and grade 2 DD. Discharge wt was 200lb. Wt of 206lb today. No sign of heart failure on exam. HF education given. Continue lasix and ARB. Not on BB due to fatigue. Will get BMET today  2. Uncontrolled HTN - BP of 170/100. Will increase hydralazine further. He is reluctant to re-start BB again. Advice to keep log. He is give us a call if BP constantly runs above 140/90.  3. Snoring - Will get sleep study evaluation.   4. Tobacco abuse - He has cut back. Advised complete cessation. Education given.  5. CAD s/p BMS to RCA 2 in 2011 -  Myoview 05/16/16 showed a fixed inferior and lateral defect without evidence of ischemia. It was felt that this was an intermediate risk study and in the absence of acute ischemia, no further diagnostic evaluation was warranted. - No anginal pain.   6. Erectile dysfunction - Trying to establish care with PCP. Will give 1 time rx of Viagra.  7. HLD -05/15/2016: Cholesterol 301*; HDL 45; LDL Cholesterol 240*; Triglycerides 79; VLDL 16  - Continue statin. Recheck lipid panel and lfts in 3 months.        Medication Adjustments/Labs and Tests Ordered: Current medicines are reviewed at length with the patient today.  Concerns regarding medicines are outlined above.  Medication changes, Labs and Tests ordered today are listed in the Patient Instructions below. Patient Instructions  Medication Instructions:  Your physician has recommended you make the following change in your medication:  1. Hydrazaline (100 mg ) three times a day 2. Viagra (100 mg ) take one half tablet 1/2 hour before sexual intercourse.    Labwork: Your physician recommends that you have lab work today: bmet   Testing/Procedures: Your physician has recommended that you have a sleep study. This test records several body functions during sleep, including: brain activity, eye movement, oxygen and carbon dioxide blood  levels, heart rate and rhythm, breathing rate and rhythm, the flow of air through your mouth and nose, snoring, body muscle movements, and chest and belly movement.    Follow-Up: Your physician wants you to follow-up in: 3 months with Dr. Anne FuSkains.  You  will receive a reminder letter in the mail two months in advance. If you don't receive a letter, please call our office to schedule the follow-up appointment.   Any Other Special Instructions Will Be Listed Below (If Applicable).     If you need a refill on your cardiac medications before your next appointment, please call your pharmacy.       Lorelei Pont, [place]  05/25/2016 4:02 PM    [facility] [address] [place], [place], [place]  [address] Phone: [phone]; Fax: [phone]

## 2016-05-26 LAB — BASIC METABOLIC PANEL
BUN: 13 mg/dL (ref 7–25)
CHLORIDE: 99 mmol/L (ref 98–110)
CO2: 26 mmol/L (ref 20–31)
Calcium: 9.1 mg/dL (ref 8.6–10.3)
Creat: 1.25 mg/dL (ref 0.70–1.25)
GLUCOSE: 99 mg/dL (ref 65–99)
POTASSIUM: 3.6 mmol/L (ref 3.5–5.3)
SODIUM: 137 mmol/L (ref 135–146)

## 2016-05-29 ENCOUNTER — Telehealth: Payer: Self-pay | Admitting: *Deleted

## 2016-05-29 DIAGNOSIS — I10 Essential (primary) hypertension: Secondary | ICD-10-CM

## 2016-05-29 NOTE — Telephone Encounter (Signed)
-----   Message from LattaBhavinkumar Bhagat, GeorgiaPA sent at 05/26/2016  1:24 PM EDT ----- Scr increased to 1.25 form 0.98 from baseline. Still WNL but almost up 30%. Continue current dose of lasix. Will need repeat BMET next week. If still elevated at that time, might need to cut back on lasix.

## 2016-05-29 NOTE — Telephone Encounter (Signed)
Pt aware of his lab results.  He will come back in 06/01/16 and repeat BMET.   Pt agreeable with this plan.  Order in EPIC.

## 2016-06-01 ENCOUNTER — Other Ambulatory Visit (INDEPENDENT_AMBULATORY_CARE_PROVIDER_SITE_OTHER): Payer: BLUE CROSS/BLUE SHIELD | Admitting: *Deleted

## 2016-06-01 DIAGNOSIS — I10 Essential (primary) hypertension: Secondary | ICD-10-CM | POA: Diagnosis not present

## 2016-06-01 LAB — BASIC METABOLIC PANEL
BUN: 17 mg/dL (ref 7–25)
CHLORIDE: 101 mmol/L (ref 98–110)
CO2: 26 mmol/L (ref 20–31)
Calcium: 9.2 mg/dL (ref 8.6–10.3)
Creat: 1.3 mg/dL — ABNORMAL HIGH (ref 0.70–1.25)
Glucose, Bld: 102 mg/dL — ABNORMAL HIGH (ref 65–99)
POTASSIUM: 3.4 mmol/L — AB (ref 3.5–5.3)
SODIUM: 139 mmol/L (ref 135–146)

## 2016-06-03 ENCOUNTER — Telehealth: Payer: Self-pay | Admitting: *Deleted

## 2016-06-03 DIAGNOSIS — I10 Essential (primary) hypertension: Secondary | ICD-10-CM

## 2016-06-03 MED ORDER — FUROSEMIDE 40 MG PO TABS: 20.0000 mg | ORAL_TABLET | Freq: Two times a day (BID) | ORAL | Status: DC

## 2016-06-03 NOTE — Telephone Encounter (Signed)
Pt aware of lab results and to start lasix 40 mg 1/2 tablet bid and will recheck bmet 7/26/170 Pt agreeable with this plan. Orders and med changes made in EPIC. Pt verbalized understanding.

## 2016-06-03 NOTE — Telephone Encounter (Signed)
-----   Message from Lake WaynokaBhavinkumar Bhagat, [place]PA sent at 06/03/2016 12:27 PM EDT ----- Scr increased further to 1.3. Cut back lasix to 20mg  BID. Recheck BMET next week. Any dyspnea or LE edema? Is his weight trending down or stable?

## 2016-06-10 ENCOUNTER — Other Ambulatory Visit (INDEPENDENT_AMBULATORY_CARE_PROVIDER_SITE_OTHER): Payer: BLUE CROSS/BLUE SHIELD | Admitting: *Deleted

## 2016-06-10 DIAGNOSIS — I10 Essential (primary) hypertension: Secondary | ICD-10-CM

## 2016-06-11 ENCOUNTER — Telehealth: Payer: Self-pay | Admitting: Physician Assistant

## 2016-06-11 ENCOUNTER — Encounter (HOSPITAL_BASED_OUTPATIENT_CLINIC_OR_DEPARTMENT_OTHER): Payer: Self-pay

## 2016-06-11 LAB — BASIC METABOLIC PANEL
BUN: 14 mg/dL (ref 7–25)
CALCIUM: 9.1 mg/dL (ref 8.6–10.3)
CO2: 26 mmol/L (ref 20–31)
CREATININE: 1 mg/dL (ref 0.70–1.25)
Chloride: 102 mmol/L (ref 98–110)
GLUCOSE: 94 mg/dL (ref 65–99)
Potassium: 4.1 mmol/L (ref 3.5–5.3)
Sodium: 140 mmol/L (ref 135–146)

## 2016-06-11 NOTE — Telephone Encounter (Signed)
Returned pts call and he has been made aware of his lab results. Pt verbalized understanding. 

## 2016-06-11 NOTE — Telephone Encounter (Signed)
Fu  Pt returning RN phone call- lab results. Please call back and discuss.   

## 2016-09-03 ENCOUNTER — Encounter: Payer: Self-pay | Admitting: Cardiology

## 2016-09-14 ENCOUNTER — Encounter (INDEPENDENT_AMBULATORY_CARE_PROVIDER_SITE_OTHER): Payer: Self-pay

## 2016-09-14 ENCOUNTER — Ambulatory Visit (INDEPENDENT_AMBULATORY_CARE_PROVIDER_SITE_OTHER): Payer: BLUE CROSS/BLUE SHIELD | Admitting: Cardiology

## 2016-09-14 ENCOUNTER — Encounter: Payer: Self-pay | Admitting: Cardiology

## 2016-09-14 VITALS — BP 160/94 | HR 94 | Ht 70.0 in | Wt 208.0 lb

## 2016-09-14 DIAGNOSIS — I255 Ischemic cardiomyopathy: Secondary | ICD-10-CM | POA: Diagnosis not present

## 2016-09-14 DIAGNOSIS — R05 Cough: Secondary | ICD-10-CM

## 2016-09-14 DIAGNOSIS — R053 Chronic cough: Secondary | ICD-10-CM

## 2016-09-14 DIAGNOSIS — I10 Essential (primary) hypertension: Secondary | ICD-10-CM

## 2016-09-14 DIAGNOSIS — I2510 Atherosclerotic heart disease of native coronary artery without angina pectoris: Secondary | ICD-10-CM

## 2016-09-14 MED ORDER — SPIRONOLACTONE 25 MG PO TABS: 25.0000 mg | ORAL_TABLET | Freq: Every day | ORAL | 1 refills | Status: DC

## 2016-09-14 MED ORDER — FUROSEMIDE 40 MG PO TABS: 20.0000 mg | ORAL_TABLET | Freq: Two times a day (BID) | ORAL | 3 refills | Status: DC

## 2016-09-14 NOTE — Patient Instructions (Addendum)
Medication Instructions:  Start spironolactone 25mg  daily  Labwork: Your physician recommends that you return for a FASTING lipid profile/CMET in 1 week.   Testing/Procedures: A chest x-ray takes a picture of the organs and structures inside the chest, including the heart, lungs, and blood vessels. This test can show several things, including, whether the heart is enlarges; whether fluid is building up in the lungs; and whether pacemaker / defibrillator leads are still in place.  [facility]:  [address]  8-4:30PM  [address] 8 AM-4PM  Follow-Up: Your physician recommends that you schedule a follow-up appointment in: 1 month with Lady SaucierLaura Ingold,NP.         If you need a refill on your cardiac medications before your next appointment, please call your pharmacy.

## 2016-09-14 NOTE — Progress Notes (Signed)
Cardiology Office Note    Date:  09/14/2016   ID:  Frank BostonJerry Andel, DOB [birth year], MRN [id number]  PCP:  No PCP Per Patient  Cardiologist:   Donato SchultzMark Ali Mohl, MD     History of Present Illness:  Frank Webb is a 60 y.o. male with resolved systolic heart failure-previously 36% EF now 60% by echocardiogram, EF 44% by nuclear stress test here for follow-up. Also has issues with uncontrolled hypertension has been reluctant to start beta blocker. He smoker. CAD with BMS to RCA 2 in 2011. No ischemia on nuclear stress test 05/16/16, lateral fixed inferior defect.  He was diuresed during hospitalization.  His cholesterol also was severely elevated 240 with total cholesterol greater than 300 consistent with familial hyperlipidemia. On atorvastatin 80 mg-has not had his lipids checked since starting in late June 2017.  He still continues to smoke and he has battled with a cold off and on, wheezing. The other day when coughing he felt fairly intense pain in his posterior right back/rib region. We are getting a chest x-ray.  Blood pressure still is very challenging to control. He admits to drinking beer. Weight gain.  Past Medical History:  Diagnosis Date  . Cardiomyopathy, ischemic    a. EF 35% in 02/2010 prior to PCI;  b. echo 07/2010: Moderate LVH, EF 45-50%, mild posterior HK, severe inferior HK, grade 1 diastolic dysfunction, mild MR, mild LAE; c. 04/2016 Echo: EF 55-60%, Gr2 DD, mild MR.  . Chronic combined systolic (congestive) and diastolic (congestive) heart failure    a. EF prev 35% in 2011-->55-60% 04/2016 echo.  . Coronary artery disease    a. 02/2010 inferior MI -> Cath: Serial proximal, mid and distal LAD 40%, apical LAD 60%, dCFX serial 70%, OM2 with chronic occlusion, small oOM3 80%, pRCA 40%, mRCA 80%, dRCA 80%, PDA serial 70%, EF 35%.  PCI: Overlapping BMS x 2 (2.75 x 32 mm Veriflex and 2.75 x 16 mm Veriflex) to RCA; c. 05/2016 MV: inf/lat infarct w/o ischemia. EF 44%.  Marland Kitchen. Epistaxis,  recurrent    "related to blood thinners; didn't last for long" (05/14/2016)  . Fatigue    beta blocker d/c'd  . Hyperlipidemia   . Hypertension   . Noncompliance   . Syncope    a. negative EP study 02/2010    Past Surgical History:  Procedure Laterality Date  . CARDIAC CATHETERIZATION  03/10/2010   This study demonstrates no inducible VT or inducible SVT in a patient with ischemic heart disease, mild LV dysfunction (normal echo, 35% by cath with prior stenting to the RCA)  --    . CORONARY ANGIOPLASTY WITH STENT PLACEMENT  03/06/2010    drug-eluting stents in the RCA    Current Medications: Outpatient Medications Prior to Visit  Medication Sig Dispense Refill  . amLODipine (NORVASC) 10 MG tablet Take 1 tablet (10 mg total) by mouth daily. 30 tablet 6  . aspirin 81 MG tablet Take 1 tablet (81 mg total) by mouth daily. 30 tablet   . atorvastatin (LIPITOR) 80 MG tablet Take 1 tablet (80 mg total) by mouth daily at 6 PM. 30 tablet 6  . hydrALAZINE (APRESOLINE) 100 MG tablet Take 1 tablet (100 mg total) by mouth 3 (three) times daily. 90 tablet 9  . losartan (COZAAR) 100 MG tablet Take 1 tablet (100 mg total) by mouth daily. 30 tablet 6  . nitroGLYCERIN (NITROSTAT) 0.4 MG SL tablet Place 1 tablet (0.4 mg total) under the tongue every 5 (five) minutes  as needed for chest pain (CP or SOB). 25 tablet 3  . sildenafil (VIAGRA) 100 MG tablet Take 0.5 tablets (50 mg total) by mouth daily as needed for erectile dysfunction. 10 tablet 0  . furosemide (LASIX) 40 MG tablet Take 0.5 tablets (20 mg total) by mouth 2 (two) times daily. 30 tablet 3   No facility-administered medications prior to visit.      Allergies:   Review of patient's allergies indicates no known allergies.   Social History   Social History  . Marital status: Divorced    Spouse name: N/A  . Number of children: N/A  . Years of education: N/A   Social History Main Topics  . Smoking status: Current Every Day Smoker     Packs/day: 1.00    Years: 40.00    Types: Cigarettes  . Smokeless tobacco: Never Used  . Alcohol use 6.6 oz/week    11 Shots of liquor per week     Comment: 05/14/2016 "pint of brandy/week"  . Drug use: No  . Sexual activity: Yes   Other Topics Concern  . None   Social History Narrative  . None     Family History:  The patient's family history includes Cancer (age of onset: 19) in his mother; Diabetes in his brother and sister; Hypertension in his brother, brother, and sister; Pneumonia (age of onset: 38) in his father.   ROS:   Please see the history of present illness.    ROS All other systems reviewed and are negative.   PHYSICAL EXAM:   VS:  BP (!) 160/94   Pulse 94   Ht 5\' 10"  (1.778 m)   Wt 208 lb (94.3 kg)   BMI 29.84 kg/m    GEN: Well nourished, well developed, in no acute distress  HEENT: normal  Neck: no JVD, carotid bruits, or masses Cardiac: RRR; no murmurs, rubs, or gallops,no edema  Respiratory:  clear to auscultation bilaterally, normal work of breathing GI: soft, nontender, nondistended, + BS, protuberant MS: no deformity or atrophy  Skin: warm and dry, no rash Neuro:  Alert and Oriented x 3, Strength and sensation are intact Psych: euthymic mood, full affect  Wt Readings from Last 3 Encounters:  09/14/16 208 lb (94.3 kg)  05/25/16 206 lb 6.4 oz (93.6 kg)  05/17/16 200 lb 3.2 oz (90.8 kg)      Studies/Labs Reviewed:   EKG:  EKG is not ordered today.    Recent Labs: 05/15/2016: TSH 0.900 05/17/2016: Hemoglobin 15.7; Platelets 212 06/10/2016: BUN 14; Creat 1.00; Potassium 4.1; Sodium 140   Lipid Panel    Component Value Date/Time   CHOL 301 (H) 05/15/2016 0423   TRIG 79 05/15/2016 0423   HDL 45 05/15/2016 0423   CHOLHDL 6.7 05/15/2016 0423   VLDL 16 05/15/2016 0423   LDLCALC 240 (H) 05/15/2016 0423    Additional studies/ records that were reviewed today include: Prior hospital records, lab work, echocardiogram reviewed    ASSESSMENT:      1. Cough, persistent   2. Cardiomyopathy, ischemic   3. Atherosclerosis of native coronary artery of native heart without angina pectoris   4. Uncontrolled hypertension      PLAN:  In order of problems listed above:  Chronic systolic heart failure  - Repeat echocardiogram during July admission 2017 demonstrated normal ejection fraction.   - Nuclear stress test with no ischemia. Prior infarct in the RCA territory  - He does not want [place] to prescribe beta blockers according  to prior notes.  - Continue with Lasix, losartan, hydralazine.  - No isosorbide because of periodic use of Viagra.  Coronary artery disease  - Prior right coronary artery stent in [id number]  Familial hyperlipidemia  - On atorvastatin 80 mg. Recheck lipid panel next week  Uncontrolled hypertension  - I will spironolactone 25 mg once a day.  - Check metabolic profile in one week  Right posterior back/rib pain  - Occurred with coughing. We will check an x-ray to ensure that there is nothing pathologic. He does have residual wheezing in the bases of his lungs. Continue to encourage tobacco cessation. I have also given him a list of potential primary care physicians so that he can follow for hypertension, wheezing etc.    Medication Adjustments/Labs and Tests Ordered: Current medicines are reviewed at length with the patient today.  Concerns regarding medicines are outlined above.  Medication changes, Labs and Tests ordered today are listed in the Patient Instructions below. Patient Instructions  Medication Instructions:  Start spironolactone 25mg  daily  Labwork: Your physician recommends that you return for a FASTING lipid profile/CMET in 1 week.   Testing/Procedures: A chest x-ray takes a picture of the organs and structures inside the chest, including the heart, lungs, and blood vessels. This test can show several things, including, whether the heart is enlarges; whether fluid is building up in the lungs;  and whether pacemaker / defibrillator leads are still in place.  [facility]:  [address]  8-4:30PM  [address] 8 AM-4PM  Follow-Up: Your physician recommends that you schedule a follow-up appointment in: 1 month with Lady SaucierLaura Ingold,NP.         If you need a refill on your cardiac medications before your next appointment, please call your pharmacy.      Signed, Donato SchultzMark Ayiana Winslett, MD  09/14/2016 4:24 PM    [facility] [address], [place], [place]  [address] Phone: [phone]; Fax: [phone]

## 2016-09-25 ENCOUNTER — Other Ambulatory Visit: Payer: BLUE CROSS/BLUE SHIELD

## 2016-09-25 ENCOUNTER — Other Ambulatory Visit: Payer: BLUE CROSS/BLUE SHIELD | Admitting: *Deleted

## 2016-09-25 DIAGNOSIS — I2510 Atherosclerotic heart disease of native coronary artery without angina pectoris: Secondary | ICD-10-CM

## 2016-09-25 DIAGNOSIS — I255 Ischemic cardiomyopathy: Secondary | ICD-10-CM

## 2016-09-25 DIAGNOSIS — R053 Chronic cough: Secondary | ICD-10-CM

## 2016-09-25 DIAGNOSIS — R05 Cough: Secondary | ICD-10-CM

## 2016-09-25 LAB — COMPREHENSIVE METABOLIC PANEL
ALBUMIN: 4.3 g/dL (ref 3.6–5.1)
ALK PHOS: 82 U/L (ref 40–115)
ALT: 16 U/L (ref 9–46)
AST: 15 U/L (ref 10–35)
BILIRUBIN TOTAL: 0.5 mg/dL (ref 0.2–1.2)
BUN: 12 mg/dL (ref 7–25)
CALCIUM: 9.3 mg/dL (ref 8.6–10.3)
CO2: 26 mmol/L (ref 20–31)
Chloride: 104 mmol/L (ref 98–110)
Creat: 0.88 mg/dL (ref 0.70–1.25)
Glucose, Bld: 73 mg/dL (ref 65–99)
Potassium: 4 mmol/L (ref 3.5–5.3)
Sodium: 140 mmol/L (ref 135–146)
Total Protein: 7.5 g/dL (ref 6.1–8.1)

## 2016-09-25 LAB — LIPID PANEL
Cholesterol: 180 mg/dL (ref <200)
HDL: 51 mg/dL (ref >40)
LDL CALC: 90 mg/dL (ref <100)
TRIGLYCERIDES: 196 mg/dL — AB (ref <150)
Total CHOL/HDL Ratio: 3.5 Ratio (ref <5.0)
VLDL: 39 mg/dL — ABNORMAL HIGH (ref <30)

## 2016-10-23 ENCOUNTER — Ambulatory Visit: Payer: BLUE CROSS/BLUE SHIELD | Admitting: Cardiology

## 2016-10-30 ENCOUNTER — Encounter: Payer: Self-pay | Admitting: Cardiology

## 2016-10-30 ENCOUNTER — Ambulatory Visit (INDEPENDENT_AMBULATORY_CARE_PROVIDER_SITE_OTHER): Payer: BLUE CROSS/BLUE SHIELD | Admitting: Cardiology

## 2016-10-30 VITALS — BP 168/98 | HR 96 | Ht 70.0 in | Wt 207.0 lb

## 2016-10-30 DIAGNOSIS — I10 Essential (primary) hypertension: Secondary | ICD-10-CM

## 2016-10-30 MED ORDER — CARVEDILOL 6.25 MG PO TABS: 6.2500 mg | ORAL_TABLET | Freq: Two times a day (BID) | ORAL | 9 refills | Status: DC

## 2016-10-30 NOTE — Patient Instructions (Signed)
Medication Instructions:  Your physician has recommended you make the following change in your medication:  1. Start Coreg ( 6.25 mg ) twice daily. Sent in today to patient's requested pharmacy.    Labwork: -None  Testing/Procedures: -None  Follow-Up: Your physician recommends that you keep your scheduled  follow-up appointment for blood pressure on Friday, December 29.   Any Other Special Instructions Will Be Listed Below (If Applicable).     If you need a refill on your cardiac medications before your next appointment, please call your pharmacy.

## 2016-10-30 NOTE — Progress Notes (Signed)
10/30/2016 Frank Webb   December 16, 1955  [id number]  Primary Physician No PCP Per Patient Primary Cardiologist: Dr. Anne Webb   Reason for Visit/CC: F/u for HTN   HPI:  Mr. Frank Webb is a 60 y/o AAM, followed by Dr. Anne Webb, who presents to clinic today for f/u for HTN.   In addition to HTN, he has a h/o systolic heart failure-previously 36% EF now 60% by echocardiogram, EF 44% by nuclear stress test here for follow-up. His BP has been difficult to control and he has been reluctant to start beta blocker s due to concern regarding sexual dysfunction. He is also a smoker and has known CAD with BMS to RCA 2 in 2011. No ischemia on nuclear stress test 05/16/16, lateral fixed inferior defect.  He was recently seen by Dr. Anne Webb on 09/14/16. His BP was elevated. Spironolactone, 25 mg was added. He had a f/u BMP 1 week later that showed stable renal function and K.   Despite the addition, his BP remains poorly controlled at 168/98 today. He acknowledges that some of this may be related to continued tobacco use. He reports full mediation compliance. No excess salt or caffeine.  He denies CP, dyspnea, HA, dizziness or visual changes but notes occasional right forearm numbness and tingling. He is currently asymptomatic.   We reviewed his medications together. He is on multiple antihypertensives and is on max doses of several of them. We discussed limited options and limited room for further titration of these meds. After discussion, he has agreed to retry low dose BB.     Current Meds  Medication Sig  . amLODipine (NORVASC) 10 MG tablet Take 1 tablet (10 mg total) by mouth daily.  Marland Kitchen. aspirin 81 MG tablet Take 1 tablet (81 mg total) by mouth daily.  Marland Kitchen. atorvastatin (LIPITOR) 80 MG tablet Take 1 tablet (80 mg total) by mouth daily at 6 PM.  . furosemide (LASIX) 40 MG tablet Take 0.5 tablets (20 mg total) by mouth 2 (two) times daily.  . hydrALAZINE (APRESOLINE) 100 MG tablet Take 1 tablet (100 mg total) by mouth  3 (three) times daily.  Marland Kitchen. losartan (COZAAR) 100 MG tablet Take 1 tablet (100 mg total) by mouth daily.  . nitroGLYCERIN (NITROSTAT) 0.4 MG SL tablet Place 1 tablet (0.4 mg total) under the tongue every 5 (five) minutes as needed for chest pain (CP or SOB).  Marland Kitchen. sildenafil (VIAGRA) 100 MG tablet Take 0.5 tablets (50 mg total) by mouth daily as needed for erectile dysfunction.  Marland Kitchen. spironolactone (ALDACTONE) 25 MG tablet Take 1 tablet (25 mg total) by mouth daily.   No Known Allergies Past Medical History:  Diagnosis Date  . Cardiomyopathy, ischemic    a. EF 35% in 02/2010 prior to PCI;  b. echo 07/2010: Moderate LVH, EF 45-50%, mild posterior HK, severe inferior HK, grade 1 diastolic dysfunction, mild MR, mild LAE; c. 04/2016 Echo: EF 55-60%, Gr2 DD, mild MR.  . Chronic combined systolic (congestive) and diastolic (congestive) heart failure    a. EF prev 35% in 2011-->55-60% 04/2016 echo.  . Coronary artery disease    a. 02/2010 inferior MI -> Cath: Serial proximal, mid and distal LAD 40%, apical LAD 60%, dCFX serial 70%, OM2 with chronic occlusion, small oOM3 80%, pRCA 40%, mRCA 80%, dRCA 80%, PDA serial 70%, EF 35%.  PCI: Overlapping BMS x 2 (2.75 x 32 mm Veriflex and 2.75 x 16 mm Veriflex) to RCA; c. 05/2016 MV: inf/lat infarct w/o ischemia. EF 44%.  .Marland Kitchen  Epistaxis, recurrent    "related to blood thinners; didn't last for long" (05/14/2016)  . Fatigue    beta blocker d/c'd  . Hyperlipidemia   . Hypertension   . Noncompliance   . Syncope    a. negative EP study 02/2010   Family History  Problem Relation Age of Onset  . Pneumonia Father [id number]  . Cancer Mother [id number]  . Diabetes Sister   . Hypertension Sister   . Hypertension Brother   . Hypertension Brother   . Diabetes Brother    Past Surgical History:  Procedure Laterality Date  . CARDIAC CATHETERIZATION  03/10/2010   This study demonstrates no inducible VT or inducible SVT in a patient with ischemic heart disease, mild LV dysfunction (normal echo,  35% by cath with prior stenting to the RCA)  --    . CORONARY ANGIOPLASTY WITH STENT PLACEMENT  03/06/2010    drug-eluting stents in the RCA   Social History   Social History  . Marital status: Divorced    Spouse name: N/A  . Number of children: N/A  . Years of education: N/A   Occupational History  . Not on file.   Social History Main Topics  . Smoking status: Current Every Day Smoker    Packs/day: 1.00    Years: 40.00    Types: Cigarettes  . Smokeless tobacco: Never Used  . Alcohol use 6.6 oz/week    11 Shots of liquor per week     Comment: 05/14/2016 "pint of brandy/week"  . Drug use: No  . Sexual activity: Yes   Other Topics Concern  . Not on file   Social History Narrative  . No narrative on file     Review of Systems: General: negative for chills, fever, night sweats or weight changes.  Cardiovascular: negative for chest pain, dyspnea on exertion, edema, orthopnea, palpitations, paroxysmal nocturnal dyspnea or shortness of breath Dermatological: negative for rash Respiratory: negative for cough or wheezing Urologic: negative for hematuria Abdominal: negative for nausea, vomiting, diarrhea, bright red blood per rectum, melena, or hematemesis Neurologic: negative for visual changes, syncope, or dizziness All other systems reviewed and are otherwise negative except as noted above.   Physical Exam:  Blood pressure (!) 168/98, pulse 96, height 5\' 10"  (1.778 m), weight 207 lb (93.9 kg).  General appearance: alert, cooperative and no distress Neck: no carotid bruit and no JVD Lungs: clear to auscultation bilaterally Heart: regular rate and rhythm, S1, S2 normal, no murmur, click, rub or gallop Extremities: extremities normal, atraumatic, no cyanosis or edema Pulses: 2+ and symmetric Skin: Skin color, texture, turgor normal. No rashes or lesions Neurologic: Grossly normal  EKG not preformed   ASSESSMENT AND PLAN:   1. HTN:  Despite recent addition of  spironolactone to his regimen, his BP remains poorly controlled at 168/98 today. He acknowledges that some of this may be related to continued tobacco use. He reports full mediation compliance. No excess salt or caffeine.  He denies CP, dyspnea, HA, dizziness or visual changes but notes occasional right forearm numbness and tingling. He is currently asymptomatic.   We reviewed his medications together. He is on multiple antihypertensives and is on max doses of several of them. We discussed limited options and limited room for further titration of these meds. After discussion, he has agreed to retry low dose BB. His resting HR is at 96 bpm. We will add Coreg, 6.25 mg BID. F/u in HTN clinic in 2 weeks to assess response/ side effects.  2. CAD: per above. Stable w/o CP. Low risk stress test in July.   3. HLD: on statin therapy with Lipitor. Recent lipid panel showed notable improvement in LDL, down from 240>>90.   4. Tobacco Abuse: smoking cessation advised.    PLAN  F/u in HTN clinic in 2 weeks to recheck BP and further adjust meds if needed.   Robbie Lis PA-C 10/30/2016 12:47 PM

## 2016-11-12 NOTE — Progress Notes (Deleted)
Patient ID: Frank Webb                 DOB: 1956/08/08                      MRN: [id number]     HPI: Frank Webb is a [age] y.o. male patient of Dr. Anne FuSkains referred by Robbie LisBrittainy Simmons PA-C  to HTN clinic. PMH includes hypertension, HF with EF 36%-60%, hyperlipidemia and CAD.      Current HTN meds: amlodipine 10mg  daily, carvedilol 6.25mg  BID, furosemide 20mg  daily, hydralazine 100mg  TID, losartan 100mg , spironolactone 25mg   Previously tried: Hydralazine 10-75mg  TID, losartan 50mg , lisinopril 20/12.5 mg, olmesartan-HCTZ 40-12.5mg   BP goal: <130/80  Family History: Brother and sister with hypertension and diabetes.   Social History: somker 1 pack per day;m EtOH   Diet:   Exercise:   Home BP readings:   Wt Readings from Last 3 Encounters:  10/30/16 207 lb (93.9 kg)  09/14/16 208 lb (94.3 kg)  05/25/16 206 lb 6.4 oz (93.6 kg)   BP Readings from Last 3 Encounters:  10/30/16 (!) 168/98  09/14/16 (!) 160/94  05/25/16 (!) 170/100   Pulse Readings from Last 3 Encounters:  10/30/16 96  09/14/16 94  05/25/16 88    Renal function: CrCl cannot be calculated (Patient's most recent lab result is older than the maximum 21 days allowed.).  Past Medical History:  Diagnosis Date  . Cardiomyopathy, ischemic    a. EF 35% in 02/2010 prior to PCI;  b. echo 07/2010: Moderate LVH, EF 45-50%, mild posterior HK, severe inferior HK, grade 1 diastolic dysfunction, mild MR, mild LAE; c. 04/2016 Echo: EF 55-60%, Gr2 DD, mild MR.  . Chronic combined systolic (congestive) and diastolic (congestive) heart failure    a. EF prev 35% in 2011-->55-60% 04/2016 echo.  . Coronary artery disease    a. 02/2010 inferior MI -> Cath: Serial proximal, mid and distal LAD 40%, apical LAD 60%, dCFX serial 70%, OM2 with chronic occlusion, small oOM3 80%, pRCA 40%, mRCA 80%, dRCA 80%, PDA serial 70%, EF 35%.  PCI: Overlapping BMS x 2 (2.75 x 32 mm Veriflex and 2.75 x 16 mm Veriflex) to RCA; c. 05/2016 MV: inf/lat infarct  w/o ischemia. EF 44%.  Marland Kitchen. Epistaxis, recurrent    "related to blood thinners; didn't last for long" (05/14/2016)  . Fatigue    beta blocker d/c'd  . Hyperlipidemia   . Hypertension   . Noncompliance   . Syncope    a. negative EP study 02/2010    Current Outpatient Prescriptions on File Prior to Visit  Medication Sig Dispense Refill  . amLODipine (NORVASC) 10 MG tablet Take 1 tablet (10 mg total) by mouth daily. 30 tablet 6  . aspirin 81 MG tablet Take 1 tablet (81 mg total) by mouth daily. 30 tablet   . atorvastatin (LIPITOR) 80 MG tablet Take 1 tablet (80 mg total) by mouth daily at 6 PM. 30 tablet 6  . carvedilol (COREG) 6.25 MG tablet Take 1 tablet (6.25 mg total) by mouth 2 (two) times daily. 60 tablet 9  . furosemide (LASIX) 40 MG tablet Take 0.5 tablets (20 mg total) by mouth 2 (two) times daily. 30 tablet 3  . hydrALAZINE (APRESOLINE) 100 MG tablet Take 1 tablet (100 mg total) by mouth 3 (three) times daily. 90 tablet 9  . losartan (COZAAR) 100 MG tablet Take 1 tablet (100 mg total) by mouth daily. 30 tablet 6  . nitroGLYCERIN (NITROSTAT)  0.4 MG SL tablet Place 1 tablet (0.4 mg total) under the tongue every 5 (five) minutes as needed for chest pain (CP or SOB). 25 tablet 3  . sildenafil (VIAGRA) 100 MG tablet Take 0.5 tablets (50 mg total) by mouth daily as needed for erectile dysfunction. 10 tablet 0  . spironolactone (ALDACTONE) 25 MG tablet Take 1 tablet (25 mg total) by mouth daily. 30 tablet 1   No current facility-administered medications on file prior to visit.     No Known Allergies   Assessment/Plan:

## 2016-11-13 ENCOUNTER — Ambulatory Visit: Payer: BLUE CROSS/BLUE SHIELD

## 2016-12-20 ENCOUNTER — Emergency Department (HOSPITAL_COMMUNITY): Payer: BLUE CROSS/BLUE SHIELD

## 2016-12-20 ENCOUNTER — Encounter (HOSPITAL_COMMUNITY): Payer: Self-pay | Admitting: Emergency Medicine

## 2016-12-20 ENCOUNTER — Emergency Department (HOSPITAL_COMMUNITY)
Admission: EM | Admit: 2016-12-20 | Discharge: 2016-12-20 | Disposition: A | Discharge status: Home / Self Care | Payer: BLUE CROSS/BLUE SHIELD | Attending: Emergency Medicine | Admitting: Emergency Medicine

## 2016-12-20 DIAGNOSIS — I2510 Atherosclerotic heart disease of native coronary artery without angina pectoris: Secondary | ICD-10-CM | POA: Diagnosis not present

## 2016-12-20 DIAGNOSIS — Y999 Unspecified external cause status: Secondary | ICD-10-CM | POA: Diagnosis not present

## 2016-12-20 DIAGNOSIS — Z7982 Long term (current) use of aspirin: Secondary | ICD-10-CM | POA: Diagnosis not present

## 2016-12-20 DIAGNOSIS — Y939 Activity, unspecified: Secondary | ICD-10-CM | POA: Diagnosis not present

## 2016-12-20 DIAGNOSIS — I5042 Chronic combined systolic (congestive) and diastolic (congestive) heart failure: Secondary | ICD-10-CM | POA: Diagnosis not present

## 2016-12-20 DIAGNOSIS — F17210 Nicotine dependence, cigarettes, uncomplicated: Secondary | ICD-10-CM | POA: Insufficient documentation

## 2016-12-20 DIAGNOSIS — Z955 Presence of coronary angioplasty implant and graft: Secondary | ICD-10-CM | POA: Diagnosis not present

## 2016-12-20 DIAGNOSIS — M545 Low back pain: Secondary | ICD-10-CM | POA: Diagnosis present

## 2016-12-20 DIAGNOSIS — M5441 Lumbago with sciatica, right side: Secondary | ICD-10-CM | POA: Insufficient documentation

## 2016-12-20 DIAGNOSIS — Y929 Unspecified place or not applicable: Secondary | ICD-10-CM | POA: Diagnosis not present

## 2016-12-20 DIAGNOSIS — W000XXA Fall on same level due to ice and snow, initial encounter: Secondary | ICD-10-CM | POA: Diagnosis not present

## 2016-12-20 DIAGNOSIS — I110 Hypertensive heart disease with heart failure: Secondary | ICD-10-CM | POA: Diagnosis not present

## 2016-12-20 DIAGNOSIS — M5431 Sciatica, right side: Secondary | ICD-10-CM

## 2016-12-20 MED ORDER — OXYCODONE-ACETAMINOPHEN 5-325 MG PO TABS: 1.0000 | ORAL_TABLET | ORAL | 0 refills | Status: DC | PRN

## 2016-12-20 MED ORDER — PREDNISONE 10 MG PO TABS: 20.0000 mg | ORAL_TABLET | Freq: Every day | ORAL | 0 refills | Status: DC

## 2016-12-20 MED ORDER — DICLOFENAC SODIUM 50 MG PO TBEC: 50.0000 mg | DELAYED_RELEASE_TABLET | Freq: Two times a day (BID) | ORAL | 0 refills | Status: DC

## 2016-12-20 MED ORDER — KETOROLAC TROMETHAMINE 60 MG/2ML IM SOLN
60.0000 mg | Freq: Once | INTRAMUSCULAR | Status: AC
  Administered 2016-12-20: 60 mg via INTRAMUSCULAR
  Filled 2016-12-20: qty 2

## 2016-12-20 NOTE — ED Notes (Signed)
Bed: WA07 Expected date:  Expected time:  Means of arrival:  Comments: 

## 2016-12-20 NOTE — ED Triage Notes (Addendum)
Patient c/o lower back pain that radiates down at his right leg x 4 days.  Patient states that he has fallen twice in past couple weeks on the ice/snow. Patient denies any urinary problems at this time.

## 2016-12-20 NOTE — Discharge Instructions (Signed)
Prescription for pain, inflammation, prednisone. You will need follow-up for your sciatica. If symptoms do not improve, you ultimately may need an MRI. Work note given.

## 2016-12-20 NOTE — ED Provider Notes (Signed)
[facility]-EMERGENCY DEPT Provider Note   CSN: [id number] Arrival date & time: 12/20/16  0719     History   Chief Complaint Chief Complaint  Patient presents with  . Back Pain  . Leg Pain    HPI Frank Webb is a 61 y.o. male.  Patient presents with pain in right buttocks radiating to the right posterior proximal thigh. He apparently fell on the ice on 2 different occasions recently hitting the lower back.  His pain is mostly located in the right posterior pelvis/buttock.  No head or neck injury. Severity of pain is moderate. Positioning and palpation make pain worse.      Past Medical History:  Diagnosis Date  . Cardiomyopathy, ischemic    a. EF 35% in 02/2010 prior to PCI;  b. echo 07/2010: Moderate LVH, EF 45-50%, mild posterior HK, severe inferior HK, grade 1 diastolic dysfunction, mild MR, mild LAE; c. 04/2016 Echo: EF 55-60%, Gr2 DD, mild MR.  . Chronic combined systolic (congestive) and diastolic (congestive) heart failure    a. EF prev 35% in 2011-->55-60% 04/2016 echo.  . Coronary artery disease    a. 02/2010 inferior MI -> Cath: Serial proximal, mid and distal LAD 40%, apical LAD 60%, dCFX serial 70%, OM2 with chronic occlusion, small oOM3 80%, pRCA 40%, mRCA 80%, dRCA 80%, PDA serial 70%, EF 35%.  PCI: Overlapping BMS x 2 (2.75 x 32 mm Veriflex and 2.75 x 16 mm Veriflex) to RCA; c. 05/2016 MV: inf/lat infarct w/o ischemia. EF 44%.  Marland Kitchen Epistaxis, recurrent    "related to blood thinners; didn't last for long" (05/14/2016)  . Fatigue    beta blocker d/c'd  . Hyperlipidemia   . Hypertension   . Noncompliance   . Syncope    a. negative EP study 02/2010    Patient Active Problem List   Diagnosis Date Noted  . Snoring 05/25/2016  . Acute on chronic combined systolic (congestive) and diastolic (congestive) heart failure 05/17/2016  . Hypertensive urgency 05/17/2016  . Elevated troponin 05/17/2016  . Acute systolic (congestive) heart failure 05/14/2016  . Coronary Artery Disease  03/23/2012  . HLD (hyperlipidemia) 03/23/2012  . Tobacco abuse 03/23/2012  . Obesity 03/23/2012  . RASH AND OTHER NONSPECIFIC SKIN ERUPTION 09/23/2010  . ANXIETY DEPRESSION 07/07/2010  . EPISTAXIS, RECURRENT 07/07/2010  . Uncontrolled hypertension 04/01/2010  . Cardiomyopathy, ischemic 04/01/2010  . SYNCOPE 04/01/2010  . FATIGUE 04/01/2010    Past Surgical History:  Procedure Laterality Date  . CARDIAC CATHETERIZATION  03/10/2010   This study demonstrates no inducible VT or inducible SVT in a patient with ischemic heart disease, mild LV dysfunction (normal echo, 35% by cath with prior stenting to the RCA)  --    . CORONARY ANGIOPLASTY WITH STENT PLACEMENT  03/06/2010    drug-eluting stents in the RCA       Home Medications    Prior to Admission medications   Medication Sig Start Date End Date Taking? Authorizing Provider  amLODipine (NORVASC) 10 MG tablet Take 1 tablet (10 mg total) by mouth daily. 05/17/16   Ok Anis, NP  aspirin 81 MG tablet Take 1 tablet (81 mg total) by mouth daily. 05/17/16   Ok Anis, NP  atorvastatin (LIPITOR) 80 MG tablet Take 1 tablet (80 mg total) by mouth daily at 6 PM. 05/17/16   Ok Anis, NP  carvedilol (COREG) 6.25 MG tablet Take 1 tablet (6.25 mg total) by mouth 2 (two) times daily. 10/30/16   Brittainy Sherlynn Carbon,  PA-C  diclofenac (VOLTAREN) 50 MG EC tablet Take 1 tablet (50 mg total) by mouth 2 (two) times daily. 12/20/16   Donnetta Hutching, MD  furosemide (LASIX) 40 MG tablet Take 0.5 tablets (20 mg total) by mouth 2 (two) times daily. 09/14/16   Jake Bathe, MD  hydrALAZINE (APRESOLINE) 100 MG tablet Take 1 tablet (100 mg total) by mouth 3 (three) times daily. 05/25/16   Bhavinkumar Bhagat, PA  losartan (COZAAR) 100 MG tablet Take 1 tablet (100 mg total) by mouth daily. 05/17/16   Ok Anis, NP  nitroGLYCERIN (NITROSTAT) 0.4 MG SL tablet Place 1 tablet (0.4 mg total) under the tongue every 5 (five) minutes as needed  for chest pain (CP or SOB). 05/17/16   Ok Anis, NP  oxyCODONE-acetaminophen (PERCOCET) 5-325 MG tablet Take 1-2 tablets by mouth every 4 (four) hours as needed. 12/20/16   Donnetta Hutching, MD  predniSONE (DELTASONE) 10 MG tablet Take 2 tablets (20 mg total) by mouth daily. 12/20/16   Donnetta Hutching, MD  sildenafil (VIAGRA) 100 MG tablet Take 0.5 tablets (50 mg total) by mouth daily as needed for erectile dysfunction. 05/25/16   Bhavinkumar Bhagat, PA  spironolactone (ALDACTONE) 25 MG tablet Take 1 tablet (25 mg total) by mouth daily. 09/14/16 12/13/16  Jake Bathe, MD    Family History Family History  Problem Relation Age of Onset  . Pneumonia Father 62  . Cancer Mother 56  . Diabetes Sister   . Hypertension Sister   . Hypertension Brother   . Hypertension Brother   . Diabetes Brother     Social History Social History  Substance Use Topics  . Smoking status: Current Every Day Smoker    Packs/day: 1.00    Years: 40.00    Types: Cigarettes  . Smokeless tobacco: Never Used  . Alcohol use 6.6 oz/week    11 Shots of liquor per week     Comment: 05/14/2016 "pint of brandy/week"     Allergies   Patient has no known allergies.   Review of Systems Review of Systems  All other systems reviewed and are negative.    Physical Exam Updated Vital Signs BP 167/86 (BP Location: Left Arm)   Pulse 76   Temp 98 F (36.7 C) (Oral)   Resp 19   Ht 5\' 9"  (1.753 m)   Wt 208 lb (94.3 kg)   SpO2 98%   BMI 30.72 kg/m   Physical Exam  Constitutional: He is oriented to person, place, and time. He appears well-developed and well-nourished.  HENT:  Head: Normocephalic and atraumatic.  Eyes: Conjunctivae are normal.  Neck: Neck supple.  Cardiovascular: Normal rate and regular rhythm.   Pulmonary/Chest: Effort normal and breath sounds normal.  Abdominal: Soft. Bowel sounds are normal.  Musculoskeletal:  Musculoskeletal tenderness in the right superior posterior buttock area. Pain with  straight leg raise on right.  Neurological: He is alert and oriented to person, place, and time.  Skin: Skin is warm and dry.  Psychiatric: He has a normal mood and affect. His behavior is normal.  Nursing note and vitals reviewed.    ED Treatments / Results  Labs (all labs ordered are listed, but only abnormal results are displayed) Labs Reviewed - No data to display  EKG  EKG Interpretation None       Radiology Dg Lumbar Spine Complete  Result Date: 12/20/2016 CLINICAL DATA:  Acute lower back and right leg pain after recent falls. EXAM: LUMBAR SPINE - COMPLETE  4+ VIEW COMPARISON:  None. FINDINGS: No fracture or spondylolisthesis is noted. Disc spaces are well-maintained. Minimal anterior osteophyte formation is noted at L3-4 and L4-5. Posterior facet joints are unremarkable. IMPRESSION: Minimal degenerative changes as described above. No acute abnormality seen in the lumbar spine. Electronically Signed   By: Lupita RaiderJames  Green Jr, M.D.   On: 12/20/2016 09:43    Procedures Procedures (including critical care time)  Medications Ordered in ED Medications  ketorolac (TORADOL) injection 60 mg (60 mg Intramuscular Given 12/20/16 0849)     Initial Impression / Assessment and Plan / ED Course  I have reviewed the triage vital signs and the nursing notes.  Pertinent labs & imaging results that were available during my care of the patient were reviewed by me and considered in my medical decision making (see chart for details).    History and physical most consistent with sciatica. Plain films of lumbar spine reveal minimal degenerative changes at L3-4 and L4-5.  Discharge medications Percocet, prednisone, Voltaren 50 mg. Diiscussed with the patient.   Final Clinical Impressions(s) / ED Diagnoses   Final diagnoses:  Sciatica of right side    New Prescriptions Discharge Medication List as of 12/20/2016 10:30 AM    START taking these medications   Details  diclofenac (VOLTAREN) 50 MG  EC tablet Take 1 tablet (50 mg total) by mouth 2 (two) times daily., Starting Sun 12/20/2016, Print    oxyCODONE-acetaminophen (PERCOCET) 5-325 MG tablet Take 1-2 tablets by mouth every 4 (four) hours as needed., Starting Sun 12/20/2016, Print    predniSONE (DELTASONE) 10 MG tablet Take 2 tablets (20 mg total) by mouth daily., Starting Sun 12/20/2016, Print         Donnetta HutchingBrian Pegi Milazzo, MD 12/20/16 1050

## 2017-01-14 ENCOUNTER — Encounter: Payer: Self-pay | Admitting: Cardiology

## 2017-01-14 ENCOUNTER — Ambulatory Visit (INDEPENDENT_AMBULATORY_CARE_PROVIDER_SITE_OTHER): Payer: BLUE CROSS/BLUE SHIELD | Admitting: Cardiology

## 2017-01-14 VITALS — BP 154/90 | HR 73 | Ht 70.0 in | Wt 214.4 lb

## 2017-01-14 DIAGNOSIS — I10 Essential (primary) hypertension: Secondary | ICD-10-CM

## 2017-01-14 DIAGNOSIS — I2510 Atherosclerotic heart disease of native coronary artery without angina pectoris: Secondary | ICD-10-CM | POA: Diagnosis not present

## 2017-01-14 DIAGNOSIS — R05 Cough: Secondary | ICD-10-CM | POA: Diagnosis not present

## 2017-01-14 DIAGNOSIS — R053 Chronic cough: Secondary | ICD-10-CM

## 2017-01-14 DIAGNOSIS — I255 Ischemic cardiomyopathy: Secondary | ICD-10-CM

## 2017-01-14 MED ORDER — ATORVASTATIN CALCIUM 80 MG PO TABS: 80.0000 mg | ORAL_TABLET | Freq: Every day | ORAL | 11 refills | Status: AC

## 2017-01-14 MED ORDER — FUROSEMIDE 40 MG PO TABS: 20.0000 mg | ORAL_TABLET | Freq: Two times a day (BID) | ORAL | 11 refills | Status: AC

## 2017-01-14 MED ORDER — CARVEDILOL 12.5 MG PO TABS: 12.5000 mg | ORAL_TABLET | Freq: Two times a day (BID) | ORAL | 11 refills | Status: DC

## 2017-01-14 NOTE — Progress Notes (Signed)
Cardiology Office Note    Date:  01/14/2017   ID:  Frank Webb, DOB 14-Sep-1956, MRN [id number]  PCP:  Smothers, Cathleen Corti, NP  Cardiologist:   Donato Schultz, MD     History of Present Illness:  Frank Webb is a 61 y.o. male with resolved systolic heart failure-previously 36% EF now 60% by echocardiogram, EF 44% by nuclear stress test here for follow-up. Also has issues with uncontrolled hypertension has been reluctant to start beta blocker. He smoker. CAD with BMS to RCA 2 in 2011. No ischemia on nuclear stress test 05/16/16, lateral fixed inferior defect.  He was diuresed during hospitalization.  His cholesterol also was severely elevated 240 with total cholesterol greater than 300 consistent with familial hyperlipidemia. On atorvastatin 80 mg-has not had his lipids checked since starting in late June 2017.  He still continues to smoke and he has battled with a cold off and on, wheezing. The other day when coughing he felt fairly intense pain in his posterior right back/rib region. We are getting a chest x-ray.  Blood pressure still is very challenging to control. He admits to drinking beer. Weight gain.  01/14/17 - had rough month in Jan, snow, fell, back pain. Next week fell again on poarch. Went to ER. Denies any chest pain, no significant shortness of breath. Has gained about 8 pounds. No orthopnea.  Past Medical History:  Diagnosis Date  . Cardiomyopathy, ischemic    a. EF 35% in 02/2010 prior to PCI;  b. echo 07/2010: Moderate LVH, EF 45-50%, mild posterior HK, severe inferior HK, grade 1 diastolic dysfunction, mild MR, mild LAE; c. 04/2016 Echo: EF 55-60%, Gr2 DD, mild MR.  . Chronic combined systolic (congestive) and diastolic (congestive) heart failure    a. EF prev 35% in 2011-->55-60% 04/2016 echo.  . Coronary artery disease    a. 02/2010 inferior MI -> Cath: Serial proximal, mid and distal LAD 40%, apical LAD 60%, dCFX serial 70%, OM2 with chronic occlusion, small oOM3 80%, pRCA  40%, mRCA 80%, dRCA 80%, PDA serial 70%, EF 35%.  PCI: Overlapping BMS x 2 (2.75 x 32 mm Veriflex and 2.75 x 16 mm Veriflex) to RCA; c. 05/2016 MV: inf/lat infarct w/o ischemia. EF 44%.  Marland Kitchen Epistaxis, recurrent    "related to blood thinners; didn't last for long" (05/14/2016)  . Fatigue    beta blocker d/c'd  . Hyperlipidemia   . Hypertension   . Noncompliance   . Syncope    a. negative EP study 02/2010    Past Surgical History:  Procedure Laterality Date  . CARDIAC CATHETERIZATION  03/10/2010   This study demonstrates no inducible VT or inducible SVT in a patient with ischemic heart disease, mild LV dysfunction (normal echo, 35% by cath with prior stenting to the RCA)  --    . CORONARY ANGIOPLASTY WITH STENT PLACEMENT  03/06/2010    drug-eluting stents in the RCA    Current Medications: Outpatient Medications Prior to Visit  Medication Sig Dispense Refill  . amLODipine (NORVASC) 10 MG tablet Take 1 tablet (10 mg total) by mouth daily. 30 tablet 6  . aspirin 81 MG tablet Take 1 tablet (81 mg total) by mouth daily. 30 tablet   . diclofenac (VOLTAREN) 50 MG EC tablet Take 1 tablet (50 mg total) by mouth 2 (two) times daily. 20 tablet 0  . hydrALAZINE (APRESOLINE) 100 MG tablet Take 1 tablet (100 mg total) by mouth 3 (three) times daily. 90 tablet 9  .  losartan (COZAAR) 100 MG tablet Take 1 tablet (100 mg total) by mouth daily. 30 tablet 6  . nitroGLYCERIN (NITROSTAT) 0.4 MG SL tablet Place 1 tablet (0.4 mg total) under the tongue every 5 (five) minutes as needed for chest pain (CP or SOB). 25 tablet 3  . spironolactone (ALDACTONE) 25 MG tablet Take 1 tablet (25 mg total) by mouth daily. 30 tablet 1  . atorvastatin (LIPITOR) 80 MG tablet Take 1 tablet (80 mg total) by mouth daily at 6 PM. 30 tablet 6  . carvedilol (COREG) 6.25 MG tablet Take 1 tablet (6.25 mg total) by mouth 2 (two) times daily. 60 tablet 9  . furosemide (LASIX) 40 MG tablet Take 0.5 tablets (20 mg total) by mouth 2 (two)  times daily. 30 tablet 3  . oxyCODONE-acetaminophen (PERCOCET) 5-325 MG tablet Take 1-2 tablets by mouth every 4 (four) hours as needed. (Patient not taking: Reported on 01/14/2017) 20 tablet 0  . predniSONE (DELTASONE) 10 MG tablet Take 2 tablets (20 mg total) by mouth daily. (Patient not taking: Reported on 01/14/2017) 15 tablet 0  . sildenafil (VIAGRA) 100 MG tablet Take 0.5 tablets (50 mg total) by mouth daily as needed for erectile dysfunction. (Patient not taking: Reported on 01/14/2017) 10 tablet 0   No facility-administered medications prior to visit.      Allergies:   Patient has no known allergies.   Social History   Social History  . Marital status: Divorced    Spouse name: N/A  . Number of children: N/A  . Years of education: N/A   Social History Main Topics  . Smoking status: Current Every Day Smoker    Packs/day: 1.00    Years: 40.00    Types: Cigarettes  . Smokeless tobacco: Never Used  . Alcohol use 6.6 oz/week    11 Shots of liquor per week     Comment: 05/14/2016 "pint of brandy/week"  . Drug use: No  . Sexual activity: Yes   Other Topics Concern  . None   Social History Narrative  . None     Family History:  The patient's family history includes Cancer (age of onset: 57) in his mother; Diabetes in his brother and sister; Hypertension in his brother, brother, and sister; Pneumonia (age of onset: 31) in his father.   ROS:   Please see the history of present illness.    ROS All other systems reviewed and are negative.   PHYSICAL EXAM:   VS:  BP (!) 154/90   Pulse 73   Ht 5\' 10"  (1.778 m)   Wt 214 lb 6.4 oz (97.3 kg)   SpO2 96%   BMI 30.76 kg/m    GEN: Well nourished, well developed, in no acute distress  HEENT: normal  Neck: no JVD, carotid bruits, or masses Cardiac: RRR; no murmurs, rubs, or gallops,no edema  Respiratory:  clear to auscultation bilaterally, normal work of breathing GI: soft, nontender, nondistended, + BS, protuberant MS: no  deformity or atrophy  Skin: warm and dry, no rash Neuro:  Alert and Oriented x 3, Strength and sensation are intact Psych: euthymic mood, full affect  Wt Readings from Last 3 Encounters:  01/14/17 214 lb 6.4 oz (97.3 kg)  12/20/16 208 lb (94.3 kg)  10/30/16 207 lb (93.9 kg)      Studies/Labs Reviewed:   EKG:  EKG is not ordered today.    Recent Labs: 05/15/2016: TSH 0.900 05/17/2016: Hemoglobin 15.7; Platelets 212 09/25/2016: ALT 16; BUN 12; Creat 0.88;  Potassium 4.0; Sodium 140   Lipid Panel    Component Value Date/Time   CHOL 180 09/25/2016 0913   TRIG 196 (H) 09/25/2016 0913   HDL 51 09/25/2016 0913   CHOLHDL 3.5 09/25/2016 0913   VLDL 39 (H) 09/25/2016 0913   LDLCALC 90 09/25/2016 0913    Additional studies/ records that were reviewed today include: Prior hospital records, lab work, echocardiogram reviewed    ASSESSMENT:    1. Uncontrolled hypertension   2. Cough, persistent   3. Cardiomyopathy, ischemic   4. Atherosclerosis of native coronary artery of native heart without angina pectoris      PLAN:  In order of problems listed above:  Chronic systolic heart failure  - Repeat echocardiogram during July admission 2017 demonstrated normal ejection fraction (was 35%).   - Nuclear stress test with no ischemia. Prior infarct in the RCA territory  - He does not want us to prescribe beta blockers according to prior notesHowever he is tolerating his carvedilol..  - Continue with Lasix, losartan, hydralazine.  - No isosorbide because of periodic use of Viagra.  Coronary artery disease  - Prior right coronary artery stent in [id number]  Familial hyperlipidemia  - On atorvastatin 80 mg. Recheck lipid panel next week  Uncontrolled hypertension  - Still elevated. We will increase his carvedilol to 12.5 mg twice a day.  - Norvasc 10, furosemide 20 twice a day, hydralazine 103 times a day, losartan 100 daily, spironolactone 25 mg once a day  Back pain  - It is been  about a month since his fall. Still having some sciatica. Encouraged him to visit his primary physician.  We'll have him come back in in 6 months.   Medication Adjustments/Labs and Tests Ordered: Current medicines are reviewed at length with the patient today.  Concerns regarding medicines are outlined above.  Medication changes, Labs and Tests ordered today are listed in the Patient Instructions below. Patient Instructions  Medication Instructions:  Please increase your Carvedilol to 12.5 mg twice a day. Continue all other medications as listed.  Follow-Up: Follow up in 6 months with Bary CastillaKaty Thompson, PA.  You will receive a letter in the mail 2 months before you are due.  Please call us when you receive this letter to schedule your follow up appointment.  If you need a refill on your cardiac medications before your next appointment, please call your pharmacy.  [facility] choosing [facility]!!        Signed, Donato SchultzMark Tabita Corbo, MD  01/14/2017 8:42 AM    Frank Webb [address], [place], [place]  [address] Phone: [phone]; Fax: [phone]

## 2017-01-14 NOTE — Patient Instructions (Signed)
Medication Instructions:  Please increase your Carvedilol to 12.5 mg twice a day. Continue all other medications as listed.  Follow-Up: Follow up in 6 months with Bary Castilla, PA.  You will receive a letter in the mail 2 months before you are due.  Please call us when you receive this letter to schedule your follow up appointment.  If you need a refill on your cardiac medications before your next appointment, please call your pharmacy.  Thank you for choosing  [facility]!!

## 2017-06-23 ENCOUNTER — Other Ambulatory Visit: Payer: Self-pay | Admitting: Physician Assistant

## 2017-06-23 DIAGNOSIS — I10 Essential (primary) hypertension: Secondary | ICD-10-CM

## 2017-06-23 DIAGNOSIS — R0683 Snoring: Secondary | ICD-10-CM

## 2017-11-08 ENCOUNTER — Other Ambulatory Visit: Payer: Self-pay | Admitting: Cardiology

## 2017-11-26 IMAGING — CR DG LUMBAR SPINE COMPLETE 4+V
5 series · 5 of 5 positions shown · non-contrast
Comparison: None.

CLINICAL DATA: Acute lower back and right leg pain after recent
falls.

EXAM:
LUMBAR SPINE - COMPLETE 4+ VIEW

[t lumbar spine ap]
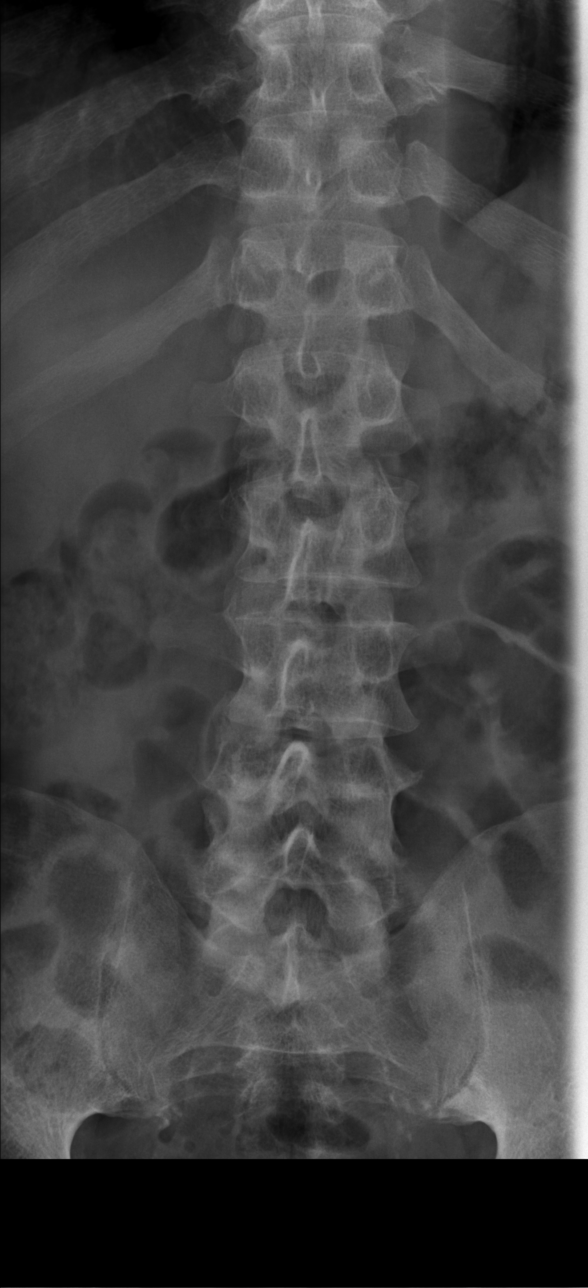

[t lumbar spine obl (1 of 2)]
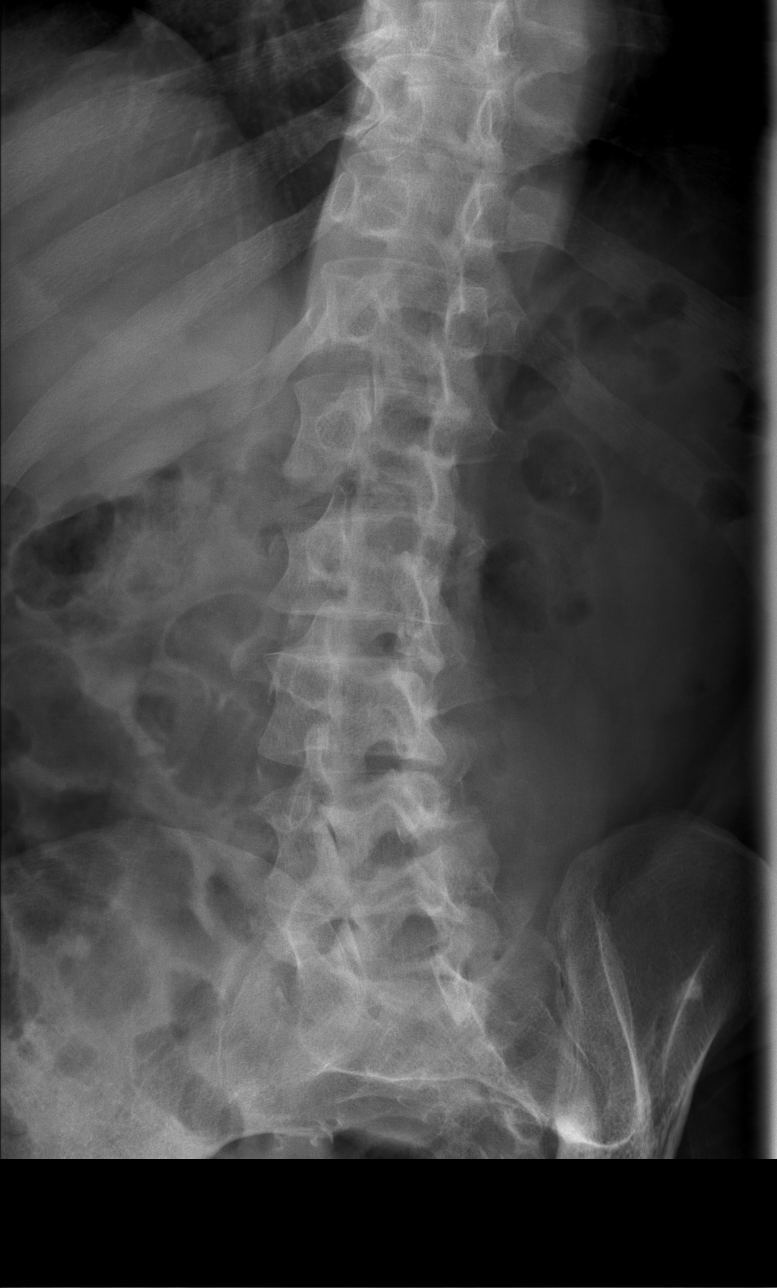

[t lumbar spine obl (2 of 2)]
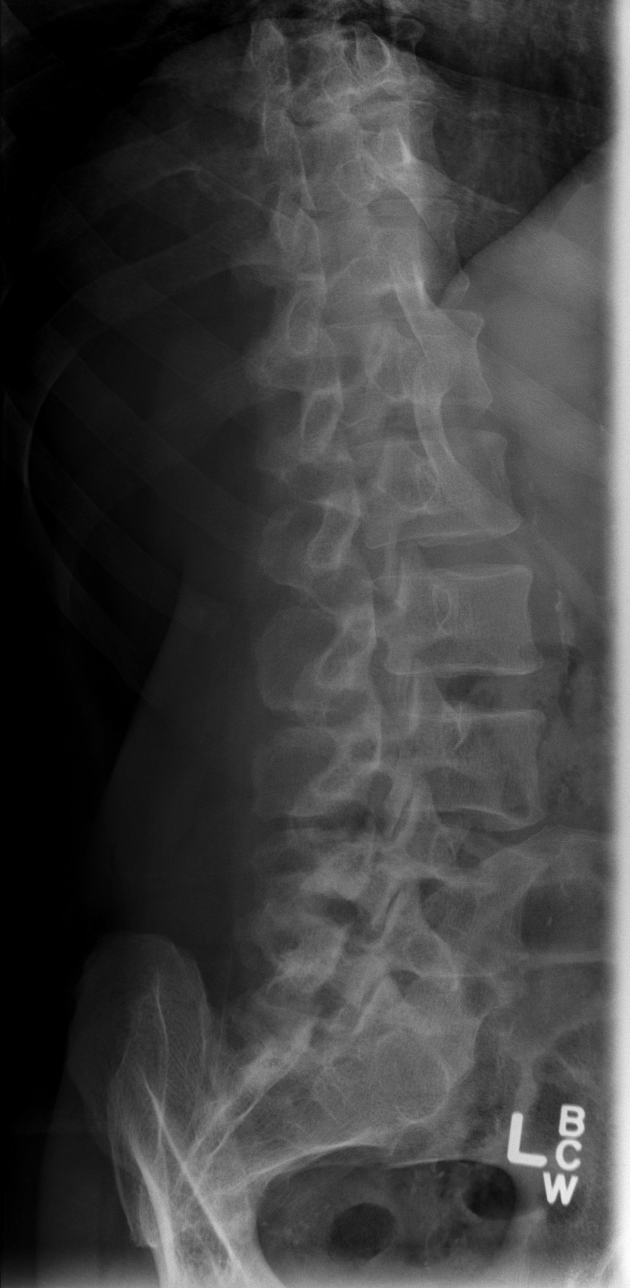

[t lumbar spine lat]
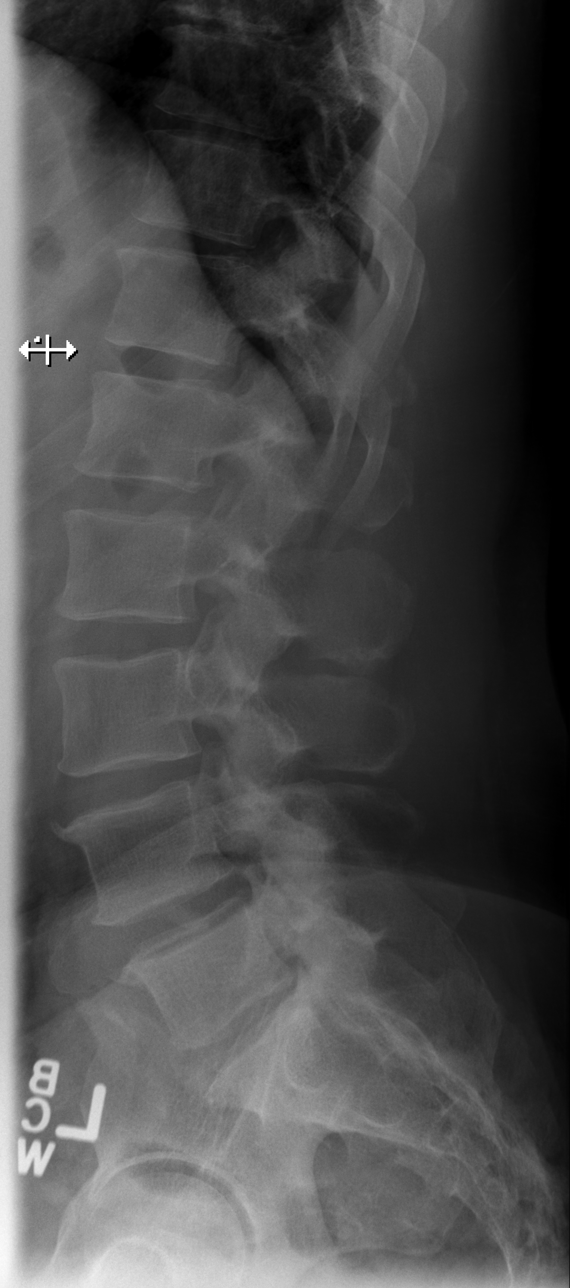

[t lumbar l-5 s-1 spot]
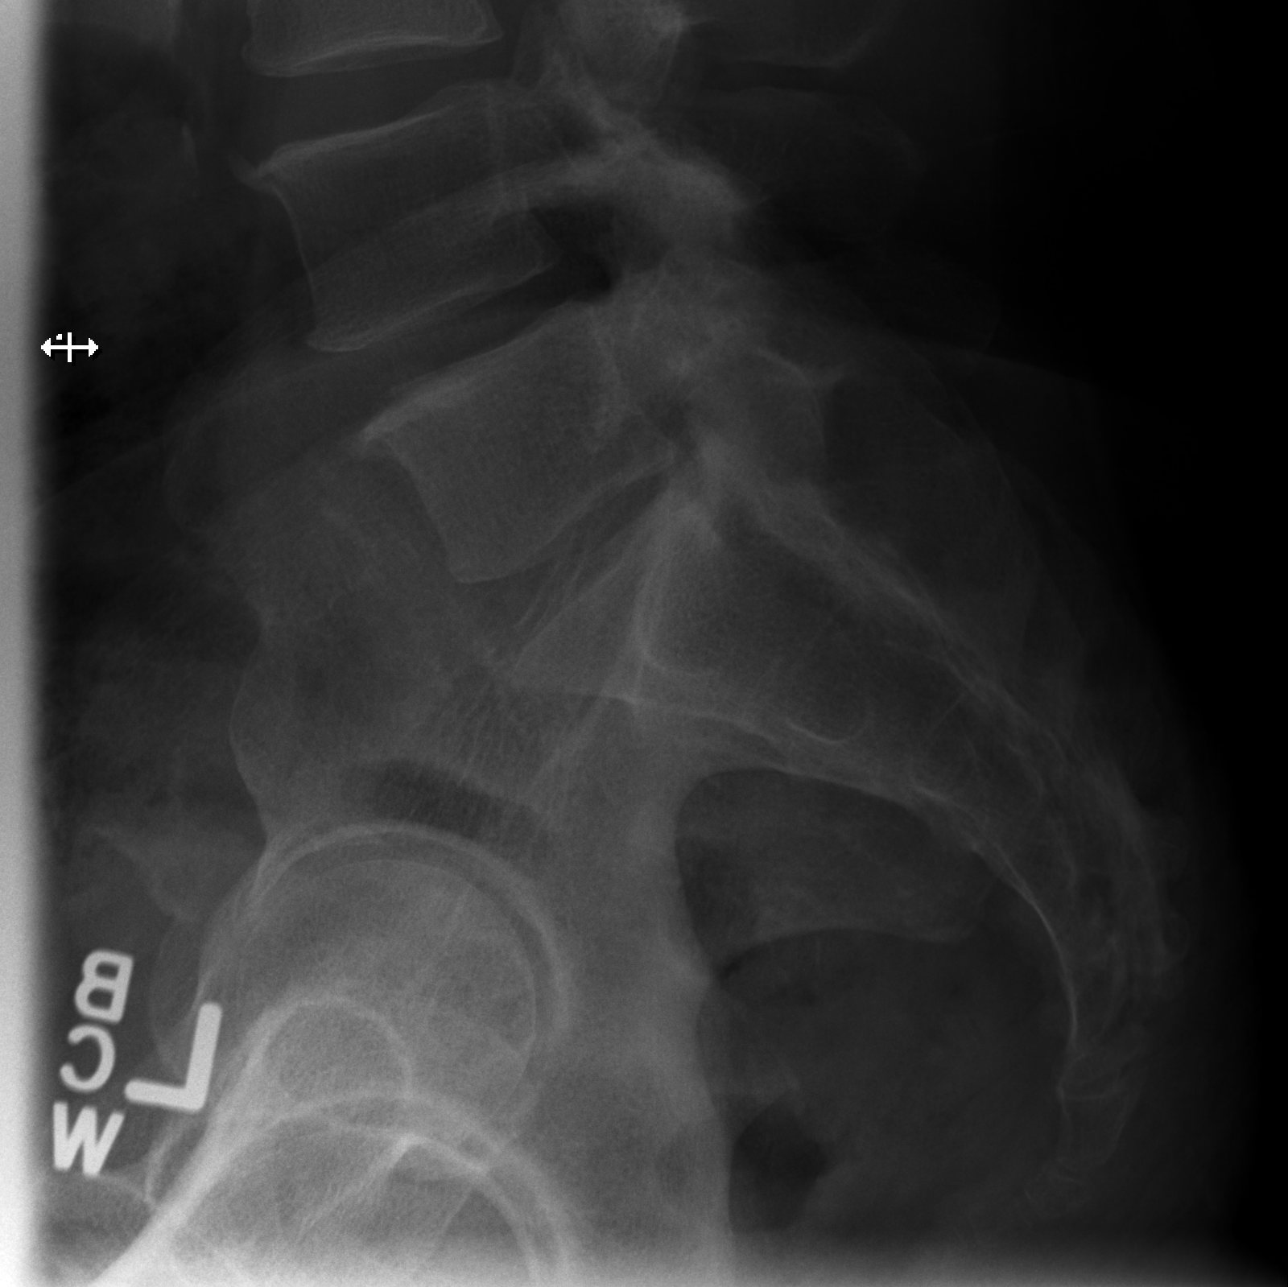

[5 of 5 positions shown; findings below may reference images not displayed]

FINDINGS: No fracture or spondylolisthesis is noted. Disc spaces are
well-maintained. Minimal anterior osteophyte formation is noted at
L3-4 and L4-5. Posterior facet joints are unremarkable.
IMPRESSION: Minimal degenerative changes as described above. No acute
abnormality seen in the lumbar spine.

## 2018-01-14 ENCOUNTER — Ambulatory Visit (INDEPENDENT_AMBULATORY_CARE_PROVIDER_SITE_OTHER): Payer: Commercial Managed Care - PPO | Admitting: Neurology

## 2018-01-14 ENCOUNTER — Encounter (INDEPENDENT_AMBULATORY_CARE_PROVIDER_SITE_OTHER): Payer: Self-pay

## 2018-01-14 ENCOUNTER — Encounter (INDEPENDENT_AMBULATORY_CARE_PROVIDER_SITE_OTHER): Payer: Commercial Managed Care - PPO | Admitting: Neurology

## 2018-01-14 DIAGNOSIS — M79601 Pain in right arm: Secondary | ICD-10-CM | POA: Diagnosis not present

## 2018-01-14 DIAGNOSIS — Z0289 Encounter for other administrative examinations: Secondary | ICD-10-CM

## 2018-01-14 NOTE — Procedures (Signed)
Full Name: Frank Webb Gender: Male MRN #: [id number] Date of Birth: 1955-12-02    Visit Date: 01/14/2018 09:01 Age: [age] Years 8 Months Old Examining Physician: Levert FeinsteinYijun Jeniyah Menor, MD  Referring Physician: Virl Sonammy Boyd, MD History: 62 year old male, presented with intermittent right arm pain,  Summary of the tests: Nerve conduction study: Right median, ulnar and radial sensory responses were normal.  Right median, ulnar motor responses were normal.  Electromyography: Selective needle examination of right upper extremity muscles and right cervical paraspinal scan were normal.  Conclusion: This is a normal study.  There is no electrodiagnostic evidence of right upper extremity neuropathy or right cervical radiculopathy.    ------------------------------- Levert FeinsteinYIjun Neidra Girvan, M.D.  [facility] [address] [place], [place] [address] Tel: [phone] Fax: [phone]        [facility]    Nerve / Sites Muscle Latency Ref. Amplitude Ref. Rel Amp Segments Distance Velocity Ref. Area    ms ms mV mV %  cm m/s m/s mVms  R Median - APB     Wrist APB 3.6 ?4.4 9.9 ?4.0 100 Wrist - APB 7   31.8     Upper arm APB 8.1  9.6  96.4 Upper arm - Wrist 24 53 ?49 31.1  R Ulnar - ADM     Wrist ADM 2.7 ?3.3 10.0 ?6.0 100 Wrist - ADM 7   33.7     B.Elbow ADM 7.1  10.1  102 B.Elbow - Wrist 22 50 ?49 32.8     A.Elbow ADM 9.1  9.4  93 A.Elbow - B.Elbow 10 51 ?49 30.5     Axilla ADM 12.6  7.8  82.5 Axilla - A.Elbow 18 52  25.2     Supraclav fossa ADM 14.7  6.8  87.6 Supraclav fossa - Axilla 11 53  26.9         A.Elbow - Wrist             SNC    Nerve / Sites Rec. Site Peak Lat Ref.  Amp Ref. Segments Distance Peak Diff Ref.    ms ms V V  cm ms ms  R Radial - Anatomical snuff box (Forearm)     Forearm Wrist 2.7 ?2.9 14 ?15 Forearm - Wrist 10    R Median, Ulnar - Transcarpal comparison     Median Palm Wrist 2.2 ?2.2 29 ?35 Median Palm - Wrist 8       Ulnar Palm Wrist 1.9 ?2.2 4 ?12 Ulnar Palm  - Wrist 8          Median Palm - Ulnar Palm  0.3 ?0.4  R Median - Orthodromic (Dig II, Mid palm)     Dig II Wrist 3.2 ?3.4 12 ?10 Dig II - Wrist 13    R Ulnar - Orthodromic, (Dig V, Mid palm)     Dig V Wrist 2.8 ?3.1 6 ?5 Dig V - Wrist 5811                  F  Wave    Nerve F Lat Ref.   ms ms  R Ulnar - ADM 30.2 ?32.0       EMG full       EMG Summary Table    Spontaneous MUAP Recruitment  Muscle IA Fib PSW Fasc Other Amp Dur. Poly Pattern  R. Pronator teres Normal None None None _______ Normal Normal Normal Normal  R. Brachioradialis Normal None None None _______ Normal Normal  Normal Normal  R. Deltoid Normal None None None _______ Normal Normal Normal Normal  R. Biceps brachii Normal None None None _______ Normal Normal Normal Normal  R. Triceps brachii Normal None None None _______ Normal Normal Normal Normal  R. Extensor digitorum communis Normal None None None _______ Normal Normal Normal Normal  R. First dorsal interosseous Normal None None None _______ Normal Normal Normal Normal  R. Cervical paraspinals Normal None None None _______ Normal Normal Normal Normal

## 2018-01-27 ENCOUNTER — Ambulatory Visit: Payer: BLUE CROSS/BLUE SHIELD | Admitting: Cardiology

## 2022-10-30 ENCOUNTER — Emergency Department (HOSPITAL_COMMUNITY): Payer: Medicare Other

## 2022-10-30 ENCOUNTER — Encounter (HOSPITAL_COMMUNITY): Payer: Self-pay

## 2022-10-30 ENCOUNTER — Inpatient Hospital Stay (HOSPITAL_COMMUNITY)
Admission: EM | Admit: 2022-10-30 | Discharge: 2022-11-03 | DRG: 193 | Disposition: A | Discharge status: Home / Self Care | Payer: Medicare Other | Attending: Internal Medicine | Admitting: Internal Medicine

## 2022-10-30 ENCOUNTER — Other Ambulatory Visit: Payer: Self-pay

## 2022-10-30 DIAGNOSIS — E860 Dehydration: Secondary | ICD-10-CM | POA: Diagnosis present

## 2022-10-30 DIAGNOSIS — J9600 Acute respiratory failure, unspecified whether with hypoxia or hypercapnia: Secondary | ICD-10-CM | POA: Diagnosis not present

## 2022-10-30 DIAGNOSIS — J9601 Acute respiratory failure with hypoxia: Secondary | ICD-10-CM | POA: Diagnosis present

## 2022-10-30 DIAGNOSIS — I5042 Chronic combined systolic (congestive) and diastolic (congestive) heart failure: Secondary | ICD-10-CM | POA: Diagnosis present

## 2022-10-30 DIAGNOSIS — I252 Old myocardial infarction: Secondary | ICD-10-CM

## 2022-10-30 DIAGNOSIS — I110 Hypertensive heart disease with heart failure: Secondary | ICD-10-CM | POA: Diagnosis present

## 2022-10-30 DIAGNOSIS — Z7982 Long term (current) use of aspirin: Secondary | ICD-10-CM

## 2022-10-30 DIAGNOSIS — E785 Hyperlipidemia, unspecified: Secondary | ICD-10-CM | POA: Diagnosis present

## 2022-10-30 DIAGNOSIS — F17210 Nicotine dependence, cigarettes, uncomplicated: Secondary | ICD-10-CM | POA: Diagnosis present

## 2022-10-30 DIAGNOSIS — J111 Influenza due to unidentified influenza virus with other respiratory manifestations: Secondary | ICD-10-CM

## 2022-10-30 DIAGNOSIS — Z1152 Encounter for screening for COVID-19: Secondary | ICD-10-CM

## 2022-10-30 DIAGNOSIS — N179 Acute kidney failure, unspecified: Secondary | ICD-10-CM | POA: Diagnosis present

## 2022-10-30 DIAGNOSIS — Z720 Tobacco use: Secondary | ICD-10-CM

## 2022-10-30 DIAGNOSIS — Z955 Presence of coronary angioplasty implant and graft: Secondary | ICD-10-CM

## 2022-10-30 DIAGNOSIS — J441 Chronic obstructive pulmonary disease with (acute) exacerbation: Secondary | ICD-10-CM | POA: Diagnosis present

## 2022-10-30 DIAGNOSIS — Z8249 Family history of ischemic heart disease and other diseases of the circulatory system: Secondary | ICD-10-CM

## 2022-10-30 DIAGNOSIS — J101 Influenza due to other identified influenza virus with other respiratory manifestations: Secondary | ICD-10-CM | POA: Diagnosis not present

## 2022-10-30 DIAGNOSIS — Z79899 Other long term (current) drug therapy: Secondary | ICD-10-CM

## 2022-10-30 DIAGNOSIS — I2510 Atherosclerotic heart disease of native coronary artery without angina pectoris: Secondary | ICD-10-CM | POA: Diagnosis present

## 2022-10-30 DIAGNOSIS — I255 Ischemic cardiomyopathy: Secondary | ICD-10-CM | POA: Diagnosis present

## 2022-10-30 LAB — CBC WITH DIFFERENTIAL/PLATELET
Abs Immature Granulocytes: 0.02 10*3/uL (ref 0.00–0.07)
Basophils Absolute: 0 10*3/uL (ref 0.0–0.1)
Basophils Relative: 0 %
Eosinophils Absolute: 0 10*3/uL (ref 0.0–0.5)
Eosinophils Relative: 0 %
HCT: 43.2 % (ref 39.0–52.0)
Hemoglobin: 13.6 g/dL (ref 13.0–17.0)
Immature Granulocytes: 0 %
Lymphocytes Relative: 3 %
Lymphs Abs: 0.3 10*3/uL — ABNORMAL LOW (ref 0.7–4.0)
MCH: 24.6 pg — ABNORMAL LOW (ref 26.0–34.0)
MCHC: 31.5 g/dL (ref 30.0–36.0)
MCV: 78.3 fL — ABNORMAL LOW (ref 80.0–100.0)
Monocytes Absolute: 0.7 10*3/uL (ref 0.1–1.0)
Monocytes Relative: 7 %
Neutro Abs: 8.9 10*3/uL — ABNORMAL HIGH (ref 1.7–7.7)
Neutrophils Relative %: 90 %
Platelets: 322 10*3/uL (ref 150–400)
RBC: 5.52 MIL/uL (ref 4.22–5.81)
RDW: 16.8 % — ABNORMAL HIGH (ref 11.5–15.5)
WBC: 9.9 10*3/uL (ref 4.0–10.5)
nRBC: 0 % (ref 0.0–0.2)

## 2022-10-30 LAB — TROPONIN I (HIGH SENSITIVITY): Troponin I (High Sensitivity): 17 ng/L (ref <18)

## 2022-10-30 LAB — BRAIN NATRIURETIC PEPTIDE: B Natriuretic Peptide: 96.1 pg/mL (ref 0.0–100.0)

## 2022-10-30 LAB — BASIC METABOLIC PANEL
Anion gap: 11 (ref 5–15)
BUN: 19 mg/dL (ref 8–23)
CO2: 24 mmol/L (ref 22–32)
Calcium: 9 mg/dL (ref 8.9–10.3)
Chloride: 103 mmol/L (ref 98–111)
Creatinine, Ser: 1.5 mg/dL — ABNORMAL HIGH (ref 0.61–1.24)
GFR, Estimated: 51 mL/min — ABNORMAL LOW (ref >60)
Glucose, Bld: 140 mg/dL — ABNORMAL HIGH (ref 70–99)
Potassium: 3.7 mmol/L (ref 3.5–5.1)
Sodium: 138 mmol/L (ref 135–145)

## 2022-10-30 LAB — RESP PANEL BY RT-PCR (RSV, FLU A&B, COVID)  RVPGX2
Influenza A by PCR: POSITIVE — AB
Influenza B by PCR: NEGATIVE
Resp Syncytial Virus by PCR: NEGATIVE
SARS Coronavirus 2 by RT PCR: NEGATIVE

## 2022-10-30 LAB — PROTIME-INR
INR: 1.1 (ref 0.8–1.2)
Prothrombin Time: 13.9 seconds (ref 11.4–15.2)

## 2022-10-30 LAB — APTT: aPTT: 29 seconds (ref 24–36)

## 2022-10-30 LAB — LACTIC ACID, PLASMA: Lactic Acid, Venous: 1.6 mmol/L (ref 0.5–1.9)

## 2022-10-30 MED ORDER — METHYLPREDNISOLONE SODIUM SUCC 125 MG IJ SOLR
125.0000 mg | Freq: Once | INTRAMUSCULAR | Status: AC
  Administered 2022-10-30: 125 mg via INTRAVENOUS
  Filled 2022-10-30: qty 2

## 2022-10-30 MED ORDER — IPRATROPIUM BROMIDE 0.02 % IN SOLN
0.5000 mg | Freq: Once | RESPIRATORY_TRACT | Status: AC
  Administered 2022-10-30: 0.5 mg via RESPIRATORY_TRACT
  Filled 2022-10-30: qty 2.5

## 2022-10-30 MED ORDER — ACETAMINOPHEN 325 MG PO TABS
650.0000 mg | ORAL_TABLET | Freq: Once | ORAL | Status: DC
  Filled 2022-10-30: qty 2

## 2022-10-30 MED ORDER — ALBUTEROL SULFATE (2.5 MG/3ML) 0.083% IN NEBU
10.0000 mg/h | INHALATION_SOLUTION | Freq: Once | RESPIRATORY_TRACT | Status: AC
  Administered 2022-10-30: 10 mg/h via RESPIRATORY_TRACT
  Filled 2022-10-30: qty 12

## 2022-10-30 MED ORDER — LACTATED RINGERS IV SOLN: INTRAVENOUS | Status: DC

## 2022-10-30 MED ORDER — LACTATED RINGERS IV BOLUS
1000.0000 mL | Freq: Once | INTRAVENOUS | Status: AC
  Administered 2022-10-30: 1000 mL via INTRAVENOUS

## 2022-10-30 MED ORDER — MAGNESIUM SULFATE 2 GM/50ML IV SOLN
2.0000 g | Freq: Once | INTRAVENOUS | Status: AC
  Administered 2022-10-30: 2 g via INTRAVENOUS
  Filled 2022-10-30: qty 50

## 2022-10-30 MED ORDER — ALBUTEROL SULFATE (2.5 MG/3ML) 0.083% IN NEBU
5.0000 mg | INHALATION_SOLUTION | Freq: Once | RESPIRATORY_TRACT | Status: AC
  Administered 2022-10-30: 5 mg via RESPIRATORY_TRACT
  Filled 2022-10-30: qty 6

## 2022-10-30 NOTE — ED Triage Notes (Signed)
Patient BIB EMS for evaluation of shortness of breath and cough.  Reports symptoms started around 2 AM this morning.  Given Albuterol 5 mg Neb and Tylenol 650 mg PO PTA.

## 2022-10-30 NOTE — ED Provider Notes (Signed)
[facility]-EMERGENCY DEPT Provider Note   CSN: [id number] Arrival date & time: 10/30/22  1914     History  Chief Complaint  Patient presents with   Cough   Shortness of Breath    Frank Webb is a 66 y.o. male.  66 year old presents with 24 hours of cough and shortness of breath.  Notes positive sick exposures.  Denies any vomiting or diarrhea.  Notes increased dyspnea exertion.  No anginal or CHF type symptoms.  Notes rhinorrhea as well as rigors.  Denies any urinary symptoms.  Used over-the-counter medications without relief.       Home Medications Prior to Admission medications   Medication Sig Start Date End Date Taking? Authorizing Provider  amLODipine (NORVASC) 10 MG tablet Take 1 tablet (10 mg total) by mouth daily. 05/17/16   Creig Hines, NP  aspirin 81 MG tablet Take 1 tablet (81 mg total) by mouth daily. 05/17/16   Creig Hines, NP  atorvastatin (LIPITOR) 80 MG tablet Take 1 tablet (80 mg total) by mouth daily at 6 PM. 01/14/17   Jake Bathe, MD  carvedilol (COREG) 12.5 MG tablet Take 1 tablet (12.5 mg total) by mouth 2 (two) times daily. 01/14/17   Jake Bathe, MD  diclofenac (VOLTAREN) 50 MG EC tablet Take 1 tablet (50 mg total) by mouth 2 (two) times daily. 12/20/16   Donnetta Hutching, MD  furosemide (LASIX) 40 MG tablet Take 0.5 tablets (20 mg total) by mouth 2 (two) times daily. 01/14/17   Jake Bathe, MD  hydrALAZINE (APRESOLINE) 100 MG tablet TAKE ONE TABLET BY MOUTH THREE TIMES DAILY 06/24/17   Jake Bathe, MD  losartan (COZAAR) 100 MG tablet Take 1 tablet (100 mg total) by mouth daily. 05/17/16   Creig Hines, NP  nitroGLYCERIN (NITROSTAT) 0.4 MG SL tablet Place 1 tablet (0.4 mg total) under the tongue every 5 (five) minutes as needed for chest pain (CP or SOB). 05/17/16   Creig Hines, NP  spironolactone (ALDACTONE) 25 MG tablet Take 1 tablet (25 mg total) by mouth daily. 09/14/16 01/14/17  Jake Bathe, MD      Allergies    Patient has no known allergies.    Review of Systems   Review of Systems  All other systems reviewed and are negative.   Physical Exam Updated Vital Signs BP 113/67 (BP Location: Left Arm)   Pulse 100   Temp (!) 102.1 F (38.9 C) (Oral)   Resp 16   Ht 1.778 m (5\' 10" )   Wt 97.3 kg   SpO2 94%   BMI 30.78 kg/m  Physical Exam Vitals and nursing note reviewed.  Constitutional:      General: He is not in acute distress.    Appearance: Normal appearance. He is well-developed. He is not toxic-appearing.  HENT:     Head: Normocephalic and atraumatic.  Eyes:     General: Lids are normal.     Conjunctiva/sclera: Conjunctivae normal.     Pupils: Pupils are equal, round, and reactive to light.  Neck:     Thyroid: No thyroid mass.     Trachea: No tracheal deviation.  Cardiovascular:     Rate and Rhythm: Normal rate and regular rhythm.     Heart sounds: Normal heart sounds. No murmur heard.    No gallop.  Pulmonary:     Effort: Pulmonary effort is normal. Prolonged expiration present. No respiratory distress.     Breath sounds: No  stridor. Examination of the right-upper field reveals decreased breath sounds. Examination of the left-upper field reveals decreased breath sounds. Decreased breath sounds present. No wheezing, rhonchi or rales.  Abdominal:     General: There is no distension.     Palpations: Abdomen is soft.     Tenderness: There is no abdominal tenderness. There is no rebound.  Musculoskeletal:        General: No tenderness. Normal range of motion.     Cervical back: Normal range of motion and neck supple.  Skin:    General: Skin is warm and dry.     Findings: No abrasion or rash.  Neurological:     Mental Status: He is alert and oriented to person, place, and time. Mental status is at baseline.     GCS: GCS eye subscore is 4. GCS verbal subscore is 5. GCS motor subscore is 6.     Cranial Nerves: No cranial nerve deficit.      Sensory: No sensory deficit.     Motor: Motor function is intact.  Psychiatric:        Attention and Perception: Attention normal.        Speech: Speech normal.        Behavior: Behavior normal.     ED Results / Procedures / Treatments   Labs (all labs ordered are listed, but only abnormal results are displayed) Labs Reviewed  RESP PANEL BY RT-PCR (RSV, FLU A&B, COVID)  [id number]  CULTURE, BLOOD (ROUTINE X 2)  CULTURE, BLOOD (ROUTINE X 2)  URINE CULTURE  CBC WITH DIFFERENTIAL/PLATELET  BASIC METABOLIC PANEL  BRAIN NATRIURETIC PEPTIDE  LACTIC ACID, PLASMA  LACTIC ACID, PLASMA  PROTIME-INR  APTT  URINALYSIS, ROUTINE W REFLEX MICROSCOPIC  TROPONIN I (HIGH SENSITIVITY)    EKG None  Radiology No results found.  Procedures Procedures    Medications Ordered in ED Medications - No data to display  ED Course/ Medical Decision Making/ A&P                           Medical Decision Making Amount and/or Complexity of Data Reviewed Labs: ordered. Radiology: ordered. ECG/medicine tests: ordered.  Risk OTC drugs. Prescription drug management.   Patient's chest x-ray per interpretation showed no evidence of infiltrate, interpretation.  He had moderate wheezing.  Given albuterol with Atrovent he feels much better.  His flu test was positive.  Remains tachypneic at this time.  Will add magnesium.  Suspect the patient has underlying COPD as he does smoke half a pack of cigarettes a day.  Suspect that fluids making this worse.  He will require admission.  Considered sepsis but felt less likely.  Lactate normal.  No leukocytosis.  Will consult hospitalist for admission       Final Clinical Impression(s) / ED Diagnoses Final diagnoses:  None    Rx / DC Orders ED Discharge Orders     None         Lorre Nick, MD 10/30/22 2303

## 2022-10-30 NOTE — ED Provider Triage Note (Signed)
Emergency Medicine Provider Triage Evaluation Note  Kenechukwu Eckstein , a 66 y.o. male  was evaluated in triage.  Pt complains of cough and shortness of breath.  Started 3 AM this morning.  Was around someone that was sick at work.  Cough nonproductive.  Feels tight in his chest and has some chest pain however feels this is related to his cough.  No pain or swelling to his lower extremities.  History of prior ischemic cardiomyopathy   Review of Systems  Positive: Cough, sob Negative: Le pain, swelling  Physical Exam  BP 113/67 (BP Location: Left Arm)   Pulse 100   Temp (!) 102.1 F (38.9 C) (Oral)   Resp 16   SpO2 94%  Gen:   Awake, no distress   Resp:  Increased effort, Hypoxic, expiratory wheeze MSK:   Moves extremities without difficulty  Other:    Medical Decision Making  Medically screening exam initiated at 7:40 PM.  Appropriate orders placed.  Jemal Miskell was informed that the remainder of the evaluation will be completed by another provider, this initial triage assessment does not replace that evaluation, and the importance of remaining in the ED until their evaluation is complete.  SOB, cough  Hypoxic here, febrile  Needs room in back   Valicia Rief A, PA-C 10/30/22 1941

## 2022-10-31 DIAGNOSIS — J9601 Acute respiratory failure with hypoxia: Secondary | ICD-10-CM | POA: Diagnosis present

## 2022-10-31 DIAGNOSIS — I252 Old myocardial infarction: Secondary | ICD-10-CM | POA: Diagnosis not present

## 2022-10-31 DIAGNOSIS — J9600 Acute respiratory failure, unspecified whether with hypoxia or hypercapnia: Secondary | ICD-10-CM | POA: Diagnosis present

## 2022-10-31 DIAGNOSIS — J441 Chronic obstructive pulmonary disease with (acute) exacerbation: Secondary | ICD-10-CM

## 2022-10-31 DIAGNOSIS — I110 Hypertensive heart disease with heart failure: Secondary | ICD-10-CM | POA: Diagnosis present

## 2022-10-31 DIAGNOSIS — E860 Dehydration: Secondary | ICD-10-CM | POA: Diagnosis present

## 2022-10-31 DIAGNOSIS — I2510 Atherosclerotic heart disease of native coronary artery without angina pectoris: Secondary | ICD-10-CM | POA: Diagnosis present

## 2022-10-31 DIAGNOSIS — E785 Hyperlipidemia, unspecified: Secondary | ICD-10-CM | POA: Diagnosis present

## 2022-10-31 DIAGNOSIS — Z1152 Encounter for screening for COVID-19: Secondary | ICD-10-CM | POA: Diagnosis not present

## 2022-10-31 DIAGNOSIS — J101 Influenza due to other identified influenza virus with other respiratory manifestations: Secondary | ICD-10-CM | POA: Diagnosis present

## 2022-10-31 DIAGNOSIS — Z79899 Other long term (current) drug therapy: Secondary | ICD-10-CM | POA: Diagnosis not present

## 2022-10-31 DIAGNOSIS — Z720 Tobacco use: Secondary | ICD-10-CM | POA: Diagnosis not present

## 2022-10-31 DIAGNOSIS — Z7982 Long term (current) use of aspirin: Secondary | ICD-10-CM | POA: Diagnosis not present

## 2022-10-31 DIAGNOSIS — I5042 Chronic combined systolic (congestive) and diastolic (congestive) heart failure: Secondary | ICD-10-CM | POA: Diagnosis present

## 2022-10-31 DIAGNOSIS — J111 Influenza due to unidentified influenza virus with other respiratory manifestations: Secondary | ICD-10-CM | POA: Diagnosis not present

## 2022-10-31 DIAGNOSIS — Z955 Presence of coronary angioplasty implant and graft: Secondary | ICD-10-CM | POA: Diagnosis not present

## 2022-10-31 DIAGNOSIS — Z8249 Family history of ischemic heart disease and other diseases of the circulatory system: Secondary | ICD-10-CM | POA: Diagnosis not present

## 2022-10-31 DIAGNOSIS — I255 Ischemic cardiomyopathy: Secondary | ICD-10-CM | POA: Diagnosis present

## 2022-10-31 DIAGNOSIS — F17210 Nicotine dependence, cigarettes, uncomplicated: Secondary | ICD-10-CM | POA: Diagnosis present

## 2022-10-31 DIAGNOSIS — N179 Acute kidney failure, unspecified: Secondary | ICD-10-CM | POA: Diagnosis present

## 2022-10-31 LAB — COMPREHENSIVE METABOLIC PANEL
ALT: 14 U/L (ref 0–44)
AST: 22 U/L (ref 15–41)
Albumin: 3.7 g/dL (ref 3.5–5.0)
Alkaline Phosphatase: 78 U/L (ref 38–126)
Anion gap: 9 (ref 5–15)
BUN: 18 mg/dL (ref 8–23)
CO2: 25 mmol/L (ref 22–32)
Calcium: 8.8 mg/dL — ABNORMAL LOW (ref 8.9–10.3)
Chloride: 104 mmol/L (ref 98–111)
Creatinine, Ser: 1.17 mg/dL (ref 0.61–1.24)
GFR, Estimated: >60 mL/min (ref >60)
Glucose, Bld: 164 mg/dL — ABNORMAL HIGH (ref 70–99)
Potassium: 4 mmol/L (ref 3.5–5.1)
Sodium: 138 mmol/L (ref 135–145)
Total Bilirubin: 0.4 mg/dL (ref 0.3–1.2)
Total Protein: 7.6 g/dL (ref 6.5–8.1)

## 2022-10-31 LAB — URINALYSIS, ROUTINE W REFLEX MICROSCOPIC
Bilirubin Urine: NEGATIVE
Glucose, UA: NEGATIVE mg/dL
Hgb urine dipstick: NEGATIVE
Ketones, ur: NEGATIVE mg/dL
Leukocytes,Ua: NEGATIVE
Nitrite: NEGATIVE
Protein, ur: NEGATIVE mg/dL
Specific Gravity, Urine: 1.016 (ref 1.005–1.030)
pH: 5 (ref 5.0–8.0)

## 2022-10-31 LAB — CBC
HCT: 39 % (ref 39.0–52.0)
Hemoglobin: 12.1 g/dL — ABNORMAL LOW (ref 13.0–17.0)
MCH: 24.2 pg — ABNORMAL LOW (ref 26.0–34.0)
MCHC: 31 g/dL (ref 30.0–36.0)
MCV: 78.2 fL — ABNORMAL LOW (ref 80.0–100.0)
Platelets: 252 10*3/uL (ref 150–400)
RBC: 4.99 MIL/uL (ref 4.22–5.81)
RDW: 16.4 % — ABNORMAL HIGH (ref 11.5–15.5)
WBC: 9.9 10*3/uL (ref 4.0–10.5)
nRBC: 0 % (ref 0.0–0.2)

## 2022-10-31 LAB — PHOSPHORUS: Phosphorus: 3.4 mg/dL (ref 2.5–4.6)

## 2022-10-31 LAB — MAGNESIUM: Magnesium: 2.2 mg/dL (ref 1.7–2.4)

## 2022-10-31 LAB — HIV ANTIBODY (ROUTINE TESTING W REFLEX): HIV Screen 4th Generation wRfx: NONREACTIVE

## 2022-10-31 MED ORDER — BUDESONIDE 0.25 MG/2ML IN SUSP
0.2500 mg | Freq: Two times a day (BID) | RESPIRATORY_TRACT | Status: DC
  Administered 2022-10-31 – 2022-11-03 (×6): 0.25 mg via RESPIRATORY_TRACT
  Filled 2022-10-31 (×5): qty 2

## 2022-10-31 MED ORDER — ENOXAPARIN SODIUM 40 MG/0.4ML IJ SOSY
40.0000 mg | PREFILLED_SYRINGE | INTRAMUSCULAR | Status: DC
  Administered 2022-10-31 – 2022-11-03 (×4): 40 mg via SUBCUTANEOUS
  Filled 2022-10-31 (×4): qty 0.4

## 2022-10-31 MED ORDER — MONTELUKAST SODIUM 10 MG PO TABS
10.0000 mg | ORAL_TABLET | Freq: Every day | ORAL | Status: DC
  Administered 2022-10-31 – 2022-11-02 (×3): 10 mg via ORAL
  Filled 2022-10-31 (×4): qty 1

## 2022-10-31 MED ORDER — METHYLPREDNISOLONE SODIUM SUCC 40 MG IJ SOLR
40.0000 mg | Freq: Every day | INTRAMUSCULAR | Status: AC
  Administered 2022-10-31 – 2022-11-02 (×3): 40 mg via INTRAVENOUS
  Filled 2022-10-31 (×3): qty 1

## 2022-10-31 MED ORDER — OSELTAMIVIR PHOSPHATE 75 MG PO CAPS
75.0000 mg | ORAL_CAPSULE | Freq: Two times a day (BID) | ORAL | Status: DC
  Administered 2022-10-31 – 2022-11-03 (×7): 75 mg via ORAL
  Filled 2022-10-31 (×7): qty 1

## 2022-10-31 MED ORDER — IPRATROPIUM-ALBUTEROL 0.5-2.5 (3) MG/3ML IN SOLN
3.0000 mL | Freq: Four times a day (QID) | RESPIRATORY_TRACT | Status: DC
  Administered 2022-10-31 – 2022-11-02 (×9): 3 mL via RESPIRATORY_TRACT
  Filled 2022-10-31 (×9): qty 3

## 2022-10-31 MED ORDER — NICOTINE 14 MG/24HR TD PT24
14.0000 mg | MEDICATED_PATCH | Freq: Every day | TRANSDERMAL | Status: DC
  Administered 2022-10-31 – 2022-11-03 (×4): 14 mg via TRANSDERMAL
  Filled 2022-10-31 (×4): qty 1

## 2022-10-31 NOTE — H&P (Signed)
History and Physical  Frank Webb VEL:[id number] DOB: 03/10/1956 DOA: 10/30/2022  Referring physician: Dr. Freida Busman, EDP. PCP: Verlon Au, MD  Outpatient Specialists: Nephrology, cardiology. Patient coming from: Home    Chief Complaint: Cough, nausea vomiting and diarrhea.  HPI: Frank Webb is a 66 y.o. male with medical history significant for essential hypertension, hyperlipidemia, COPD, who presented to [facility] ED with complaints of nonproductive cough, nausea vomiting and diarrhea with onset in the past 24 hours.  He took over-the-counter medications without any improvement.  EMS was activated and he was brought to the ED.  In the ED, influenza A by PCR positive.  Chest x-ray nonacute.  He was started on supportive care.  Received nebulizer treatments, IV magnesium and IV steroids.  TRH, hospitalist service was asked to admit.  ED Course: Tmax 102.1.  BP 107/82.  Pulse 88, respiratory 27, O2 saturation 100% on 6 L.  Lab studies remarkable for serum glucose 140, creatinine 1.5 with normal baseline creatinine.  Review of Systems: Review of systems as noted in the HPI. All other systems reviewed and are negative.   Past Medical History:  Diagnosis Date   Cardiomyopathy, ischemic    a. EF 35% in 02/2010 prior to PCI;  b. echo 07/2010: Moderate LVH, EF 45-50%, mild posterior HK, severe inferior HK, grade 1 diastolic dysfunction, mild MR, mild LAE; c. 04/2016 Echo: EF 55-60%, Gr2 DD, mild MR.   Chronic combined systolic (congestive) and diastolic (congestive) heart failure (HCC)    a. EF prev 35% in 2011-->55-60% 04/2016 echo.   Coronary artery disease    a. 02/2010 inferior MI -> Cath: Serial proximal, mid and distal LAD 40%, apical LAD 60%, dCFX serial 70%, OM2 with chronic occlusion, small oOM3 80%, pRCA 40%, mRCA 80%, dRCA 80%, PDA serial 70%, EF 35%.  PCI: Overlapping BMS x 2 (2.75 x 32 mm Veriflex and 2.75 x 16 mm Veriflex) to RCA; c. 05/2016 MV: inf/lat infarct w/o ischemia. EF 44%.    Epistaxis, recurrent    "related to blood thinners; didn't last for long" (05/14/2016)   Fatigue    beta blocker d/c'd   Hyperlipidemia    Hypertension    Noncompliance    Syncope    a. negative EP study 02/2010   Past Surgical History:  Procedure Laterality Date   CARDIAC CATHETERIZATION  03/10/2010   This study demonstrates no inducible VT or inducible SVT in a patient with ischemic heart disease, mild LV dysfunction (normal echo, 35% by cath with prior stenting to the RCA)  --     CORONARY ANGIOPLASTY WITH STENT PLACEMENT  03/06/2010    drug-eluting stents in the RCA    Social History:  reports that he has been smoking cigarettes. He has a 40.00 pack-year smoking history. He has never used smokeless tobacco. He reports current alcohol use of about 11.0 standard drinks of alcohol per week. He reports that he does not use drugs.   No Known Allergies  Family History  Problem Relation Age of Onset   Pneumonia Father 33   Cancer Mother 43   Diabetes Sister    Hypertension Sister    Hypertension Brother    Hypertension Brother    Diabetes Brother       Prior to Admission medications   Medication Sig Start Date End Date Taking? Authorizing Provider  amLODipine (NORVASC) 10 MG tablet Take 1 tablet (10 mg total) by mouth daily. 05/17/16   Creig Hines, NP  aspirin 81 MG tablet Take  1 tablet (81 mg total) by mouth daily. 05/17/16   Creig Hines, NP  atorvastatin (LIPITOR) 80 MG tablet Take 1 tablet (80 mg total) by mouth daily at 6 PM. 01/14/17   Jake Bathe, MD  carvedilol (COREG) 12.5 MG tablet Take 1 tablet (12.5 mg total) by mouth 2 (two) times daily. 01/14/17   Jake Bathe, MD  diclofenac (VOLTAREN) 50 MG EC tablet Take 1 tablet (50 mg total) by mouth 2 (two) times daily. 12/20/16   Donnetta Hutching, MD  furosemide (LASIX) 40 MG tablet Take 0.5 tablets (20 mg total) by mouth 2 (two) times daily. 01/14/17   Jake Bathe, MD  hydrALAZINE (APRESOLINE) 100 MG  tablet TAKE ONE TABLET BY MOUTH THREE TIMES DAILY 06/24/17   Jake Bathe, MD  losartan (COZAAR) 100 MG tablet Take 1 tablet (100 mg total) by mouth daily. 05/17/16   Creig Hines, NP  nitroGLYCERIN (NITROSTAT) 0.4 MG SL tablet Place 1 tablet (0.4 mg total) under the tongue every 5 (five) minutes as needed for chest pain (CP or SOB). 05/17/16   Creig Hines, NP  spironolactone (ALDACTONE) 25 MG tablet Take 1 tablet (25 mg total) by mouth daily. 09/14/16 01/14/17  Jake Bathe, MD    Physical Exam: BP 107/82   Pulse 88   Temp 98.3 F (36.8 C) (Oral)   Resp (!) 27   Ht 5\' 10"  (1.778 m)   Wt 97.3 kg   SpO2 100%   BMI 30.78 kg/m   General: 66 y.o. year-old male well developed well nourished in no acute distress.  Alert and oriented x3. Cardiovascular: Regular rate and rhythm with no rubs or gallops.  No thyromegaly or JVD noted.  No lower extremity edema. 2/4 pulses in all 4 extremities. Respiratory: Mild diffuse wheezing bilaterally.  Poor inspiratory effort. Abdomen: Soft nontender nondistended with normal bowel sounds x4 quadrants. Muskuloskeletal: No cyanosis, clubbing or edema noted bilaterally Neuro: CN II-XII intact, strength, sensation, reflexes Skin: No ulcerative lesions noted or rashes Psychiatry: Judgement and insight appear normal. Mood is appropriate for condition and setting          Labs on Admission:  Basic Metabolic Panel: Recent Labs  Lab 10/30/22 2103  NA 138  K 3.7  CL 103  CO2 24  GLUCOSE 140*  BUN 19  CREATININE 1.50*  CALCIUM 9.0   Liver Function Tests: No results for input(s): "AST", "ALT", "ALKPHOS", "BILITOT", "PROT", "ALBUMIN" in the last 168 hours. No results for input(s): "LIPASE", "AMYLASE" in the last 168 hours. No results for input(s): "AMMONIA" in the last 168 hours. CBC: Recent Labs  Lab 10/30/22 2103  WBC 9.9  NEUTROABS 8.9*  HGB 13.6  HCT 43.2  MCV 78.3*  PLT 322   Cardiac Enzymes: No results for  input(s): "CKTOTAL", "CKMB", "CKMBINDEX", "TROPONINI" in the last 168 hours.  BNP (last 3 results) Recent Labs    10/30/22 2103  BNP 96.1    ProBNP (last 3 results) No results for input(s): "PROBNP" in the last 8760 hours.  CBG: No results for input(s): "GLUCAP" in the last 168 hours.  Radiological Exams on Admission: DG Chest Port 1 View  Result Date: 10/30/2022 CLINICAL DATA:  Shortness of breath and cough EXAM: PORTABLE CHEST 1 VIEW COMPARISON:  05/14/2016 FINDINGS: The lungs appear clear. Cardiac and mediastinal contours normal. No pleural effusion identified. Mild degenerative right AC joint spurring. Mild midthoracic spondylosis. IMPRESSION: 1. No active cardiopulmonary disease is radiographically apparent. 2. Mild  midthoracic spondylosis. Electronically Signed   By: Gaylyn Rong M.D.   On: 10/30/2022 20:48    EKG: I independently viewed the EKG done and my findings are as followed: None available at the time of this visit.  Assessment/Plan Present on Admission:  COPD with acute exacerbation (HCC)  Principal Problem:   COPD with acute exacerbation (HCC)  Influenza A viral infection. Tamiflu 75 mg twice daily x 5 days DuoNebs every 6 hours IV Solu-Medrol 40 mg daily IV antiemetics as needed  AKI, prerenal in the setting of dehydration from poor oral intake Normal creatinine and GFR baseline Presented with creatinine of 1.5 and GFR 51 Received IV fluid in the ED, continue gentle IV fluid hydration Avoid nephrotoxic agents, dehydration and hypotension Monitor urine output Repeat renal function test in the morning  Tobacco use disorder Nicotine patch   DVT prophylaxis: Subcu Lovenox daily  Code Status: Full code  Family Communication: None at bedside  Disposition Plan: Admitted to telemetry unit  Consults called: None  Admission status: Observation status.   Status is: Observation    Darlin Drop MD [facility] Pager  [phone]  If 7PM-7AM, please contact night-coverage [email or web address] Password [facility]  10/31/2022, 6:17 AM

## 2022-10-31 NOTE — Progress Notes (Signed)
PROGRESS NOTE    Frank Webb  ZOX:[id number] DOB: 11/13/1956 DOA: 10/30/2022 PCP: Verlon Au, MD  Outpatient Specialists:     Brief Narrative:  As per H&P done-today: "66 year old presents with 24 hours of cough and shortness of breath. Notes positive sick exposures. Denies any vomiting or diarrhea. Notes increased dyspnea exertion. No anginal or CHF type symptoms. Notes rhinorrhea as well as rigors. Denies any urinary symptoms. Used over-the-counter medications without relief".   10/31/2022: Patient seen.  Influenza A by PCR came back positive.  Patient reports history of asthma.  Mild inspiratory and expiratory wheezes noted.   Assessment & Plan:   Principal Problem:   COPD with acute exacerbation (HCC)   Acute respiratory failure: -History of asthma. -Cannot rule out history of COPD. -Current influenza A infection. -Inspiratory and expiratory wheezing. -Continue Tamiflu, neb treatment and steroids. -Continue pulmicort. -Further management to depend on above.  Possible COPD with exacerbation: -See above documentation.  Bronchitis: -Viral load return. -Continue Tamiflu.  Possible asthma exacerbation: -Continue steroid. -Continue DuoNeb. -Singulair. -Peak flow daily. -Treated with viral infection.   DVT prophylaxis: Subcutaneous Lovenox Code Status: Full code Family Communication:  Disposition Plan: Home eventually   Consultants:  None  Procedures:  None  Antimicrobials:  None   Subjective: Shortness of breath and wheezing.  Objective: Vitals:   10/31/22 1515 10/31/22 1522 10/31/22 1600 10/31/22 1615  BP: 123/74  136/83   Pulse: 79  73 64  Resp: (!) 29  18 19   Temp:  98.7 F (37.1 C)    TempSrc:  Oral    SpO2: 100%  92% 91%  Weight:      Height:       No intake or output data in the 24 hours ending 10/31/22 1615 Filed Weights   10/30/22 2036  Weight: 97.3 kg    Examination:  General exam: Appears calm and  comfortable. Respiratory system: Inspiratory and expiratory wheeze.  Decreased air entry.   Cardiovascular system: S1 & S2 heard Gastrointestinal system: Abdomen is nondistended, soft and nontender. No organomegaly or masses felt. Normal bowel sounds heard. Central nervous system: Awake and alert.  Patient moves all extremities.   Extremities: Fullness of the ankle  Data Reviewed: I have personally reviewed following labs and imaging studies  CBC: Recent Labs  Lab 10/30/22 2103 10/31/22 0715  WBC 9.9 9.9  NEUTROABS 8.9*  --   HGB 13.6 12.1*  HCT 43.2 39.0  MCV 78.3* 78.2*  PLT 322 252   Basic Metabolic Panel: Recent Labs  Lab 10/30/22 2103 10/31/22 0715  NA 138 138  K 3.7 4.0  CL 103 104  CO2 24 25  GLUCOSE 140* 164*  BUN 19 18  CREATININE 1.50* 1.17  CALCIUM 9.0 8.8*  MG  --  2.2  PHOS  --  3.4   GFR: Estimated Creatinine Clearance: 72.6 mL/min (by C-G formula based on SCr of 1.17 mg/dL). Liver Function Tests: Recent Labs  Lab 10/31/22 0715  AST 22  ALT 14  ALKPHOS 78  BILITOT 0.4  PROT 7.6  ALBUMIN 3.7   No results for input(s): "LIPASE", "AMYLASE" in the last 168 hours. No results for input(s): "AMMONIA" in the last 168 hours. Coagulation Profile: Recent Labs  Lab 10/30/22 2103  INR 1.1   Cardiac Enzymes: No results for input(s): "CKTOTAL", "CKMB", "CKMBINDEX", "TROPONINI" in the last 168 hours. BNP (last 3 results) No results for input(s): "PROBNP" in the last 8760 hours. HbA1C: No results for input(s): "HGBA1C" in the  last 72 hours. CBG: No results for input(s): "GLUCAP" in the last 168 hours. Lipid Profile: No results for input(s): "CHOL", "HDL", "LDLCALC", "TRIG", "CHOLHDL", "LDLDIRECT" in the last 72 hours. Thyroid Function Tests: No results for input(s): "TSH", "T4TOTAL", "FREET4", "T3FREE", "THYROIDAB" in the last 72 hours. Anemia Panel: No results for input(s): "VITAMINB12", "FOLATE", "FERRITIN", "TIBC", "IRON", "RETICCTPCT" in the  last 72 hours. Urine analysis:    Component Value Date/Time   COLORURINE YELLOW 10/30/2022 0906   APPEARANCEUR CLEAR 10/30/2022 0906   LABSPEC 1.016 10/30/2022 0906   PHURINE 5.0 10/30/2022 0906   GLUCOSEU NEGATIVE 10/30/2022 0906   HGBUR NEGATIVE 10/30/2022 0906   BILIRUBINUR NEGATIVE 10/30/2022 0906   KETONESUR NEGATIVE 10/30/2022 0906   PROTEINUR NEGATIVE 10/30/2022 0906   UROBILINOGEN 0.2 03/03/2010 0159   NITRITE NEGATIVE 10/30/2022 0906   LEUKOCYTESUR NEGATIVE 10/30/2022 0906   Sepsis Labs: @LABRCNTIP (procalcitonin:4,lacticidven:4)  ) Recent Results (from the past 240 hour(s))  Resp panel by RT-PCR (RSV, Flu A&B, Covid) Anterior Nasal Swab     Status: Abnormal   Collection Time: 10/30/22  9:41 PM   Specimen: Anterior Nasal Swab  Result Value Ref Range Status   SARS Coronavirus 2 by RT PCR NEGATIVE NEGATIVE Final    Comment: (NOTE) SARS-CoV-2 target nucleic acids are NOT DETECTED.  The SARS-CoV-2 RNA is generally detectable in upper respiratory specimens during the acute phase of infection. The lowest concentration of SARS-CoV-2 viral copies this assay can detect is 138 copies/mL. A negative result does not preclude SARS-Cov-2 infection and should not be used as the sole basis for treatment or other patient management decisions. A negative result may occur with  improper specimen collection/handling, submission of specimen other than nasopharyngeal swab, presence of viral mutation(s) within the areas targeted by this assay, and inadequate number of viral copies(<138 copies/mL). A negative result must be combined with clinical observations, patient history, and epidemiological information. The expected result is Negative.  Fact Sheet for Patients:  11/01/22  Fact Sheet for Healthcare Providers:  BloggerCourse.com  This test is no t yet approved or cleared by the [employer] FDA and  has been authorized  for detection and/or diagnosis of SARS-CoV-2 by FDA under an Emergency Use Authorization (EUA). This EUA will remain  in effect (meaning this test can be used) for the duration of the COVID-19 declaration under Section 564(b)(1) of the Act, 21 U.S.C.section 360bbb-3(b)(1), unless the authorization is terminated  or revoked sooner.       Influenza A by PCR POSITIVE (A) NEGATIVE Final   Influenza B by PCR NEGATIVE NEGATIVE Final    Comment: (NOTE) The Xpert Xpress SARS-CoV-2/FLU/RSV plus assay is intended as an aid in the diagnosis of influenza from Nasopharyngeal swab specimens and should not be used as a sole basis for treatment. Nasal washings and aspirates are unacceptable for Xpert Xpress SARS-CoV-2/FLU/RSV testing.  Fact Sheet for Patients: [place]  Fact Sheet for Healthcare Providers: BloggerCourse.com  This test is not yet approved or cleared by the [employer] FDA and has been authorized for detection and/or diagnosis of SARS-CoV-2 by FDA under an Emergency Use Authorization (EUA). This EUA will remain in effect (meaning this test can be used) for the duration of the COVID-19 declaration under Section 564(b)(1) of the Act, 21 U.S.C. section 360bbb-3(b)(1), unless the authorization is terminated or revoked.     Resp Syncytial Virus by PCR NEGATIVE NEGATIVE Final    Comment: (NOTE) Fact Sheet for Patients: [place]  Fact Sheet for Healthcare Providers: BloggerCourse.com  This  test is not yet approved or cleared by the [place] and has been authorized for detection and/or diagnosis of SARS-CoV-2 by FDA under an Emergency Use Authorization (EUA). This EUA will remain in effect (meaning this test can be used) for the duration of the COVID-19 declaration under Section 564(b)(1) of the Act, 21 U.S.C. section 360bbb-3(b)(1), unless the  authorization is terminated or revoked.  Performed at [facility], [address] [place], [place] [address]   Blood Culture (routine x 2)     Status: None (Preliminary result)   Collection Time: 10/31/22 10:23 AM   Specimen: BLOOD  Result Value Ref Range Status   Specimen Description BLOOD LEFT ANTECUBITAL  Final   Special Requests   Final    BOTTLES DRAWN AEROBIC AND ANAEROBIC Blood Culture adequate volume Performed at [facility], [address], [place], [place] [address]    Culture PENDING  Incomplete   Report Status PENDING  Incomplete         Radiology Studies: DG Chest Port 1 View  Result Date: 10/30/2022 CLINICAL DATA:  Shortness of breath and cough EXAM: PORTABLE CHEST 1 VIEW COMPARISON:  05/14/2016 FINDINGS: The lungs appear clear. Cardiac and mediastinal contours normal. No pleural effusion identified. Mild degenerative right AC joint spurring. Mild midthoracic spondylosis. IMPRESSION: 1. No active cardiopulmonary disease is radiographically apparent. 2. Mild midthoracic spondylosis. Electronically Signed   By: Gaylyn Rong M.D.   On: 10/30/2022 20:48        Scheduled Meds:  acetaminophen  650 mg Oral Once   budesonide (PULMICORT) nebulizer solution  0.25 mg Nebulization BID   enoxaparin (LOVENOX) injection  40 mg Subcutaneous Q24H   ipratropium-albuterol  3 mL Nebulization Q6H   methylPREDNISolone (SOLU-MEDROL) injection  40 mg Intravenous Daily   montelukast  10 mg Oral QHS   nicotine  14 mg Transdermal Daily   oseltamivir  75 mg Oral BID   Continuous Infusions:  lactated ringers Stopped (10/31/22 0609)     LOS: 0 days    Time spent: 55 minutes.    Berton Mount, MD  [facility] Pager #: [phone] 7PM-7AM contact night coverage as above

## 2022-11-01 DIAGNOSIS — J9601 Acute respiratory failure with hypoxia: Secondary | ICD-10-CM

## 2022-11-01 DIAGNOSIS — J111 Influenza due to unidentified influenza virus with other respiratory manifestations: Secondary | ICD-10-CM

## 2022-11-01 DIAGNOSIS — J441 Chronic obstructive pulmonary disease with (acute) exacerbation: Secondary | ICD-10-CM | POA: Diagnosis not present

## 2022-11-01 LAB — URINE CULTURE: Culture: NO GROWTH

## 2022-11-01 MED ORDER — BENZONATATE 100 MG PO CAPS
200.0000 mg | ORAL_CAPSULE | Freq: Three times a day (TID) | ORAL | Status: DC | PRN
  Administered 2022-11-01: 200 mg via ORAL
  Filled 2022-11-01: qty 2

## 2022-11-01 MED ORDER — GUAIFENESIN-DM 100-10 MG/5ML PO SYRP
5.0000 mL | ORAL_SOLUTION | ORAL | Status: DC | PRN
  Administered 2022-11-01 (×2): 5 mL via ORAL
  Filled 2022-11-01 (×2): qty 5

## 2022-11-01 NOTE — Progress Notes (Signed)
[facility]                                                                               Frank Webb, is a 66 y.o. male, DOB - November 12, 1956, KDX:[id number] Admit date - 10/30/2022    Outpatient Primary MD for the patient is Verlon Au, MD  LOS - 1  days    Brief summary    Frank Webb is a 66 y.o. male with medical history significant for essential hypertension, hyperlipidemia, COPD, who presented to [facility] ED with complaints of nonproductive cough, nausea vomiting and diarrhea with onset in the past 24 hours.  He took over-the-counter medications without any improvement.  EMS was activated and he was brought to the ED.   In the ED, influenza A by PCR positive.  Chest x-ray nonacute.  He was started on supportive care.  Assessment & Plan    Assessment and Plan:   Acute respiratory failure with hypoxia sec to influenza A infection and COPD exacerbation Improving.   Complete the course of Tamiflu , along with steroids and duonebs.    AKI;  From dehydration.  Improving.  Gentle hydration.    Tobacco use disorder - nicotine patch.    Hypertension:  Well controlled.   Estimated body mass index is 30.78 kg/m as calculated from the following:   Height as of this encounter: 5\' 10"  (1.778 m).   Weight as of this encounter: 97.3 kg.  Code Status: full code.  DVT Prophylaxis:  enoxaparin (LOVENOX) injection 40 mg Start: 10/31/22 1000   Level of Care: Level of care: Telemetry Family Communication: none at bedside.   Disposition Plan:     Remains inpatient appropriate:  possible dc in am.    Procedures:  None.   Consultants:   None.   Antimicrobials:   Anti-infectives (From admission, onward)    Start     Dose/Rate Route Frequency Ordered Stop   10/31/22 0630  oseltamivir (TAMIFLU) capsule 75 mg        75 mg Oral 2 times daily 10/31/22 0622 11/05/22 0959        Medications  Scheduled Meds:  acetaminophen  650 mg Oral Once    budesonide (PULMICORT) nebulizer solution  0.25 mg Nebulization BID   enoxaparin (LOVENOX) injection  40 mg Subcutaneous Q24H   ipratropium-albuterol  3 mL Nebulization Q6H   methylPREDNISolone (SOLU-MEDROL) injection  40 mg Intravenous Daily   montelukast  10 mg Oral QHS   nicotine  14 mg Transdermal Daily   oseltamivir  75 mg Oral BID   Continuous Infusions:  lactated ringers 125 mL/hr at 11/01/22 1734   PRN Meds:.benzonatate, guaiFENesin-dextromethorphan    Subjective:   Frank Webb was seen and examined today.  No chest pain, breathing has improved.   Objective:   Vitals:   11/01/22 0454 11/01/22 0750 11/01/22 1222 11/01/22 1325  BP: 139/81  127/85   Pulse: 78  65   Resp: 20  18   Temp: 98.2 F (36.8 C)  98.6 F (37 C)   TempSrc: Oral  Oral   SpO2: 99% 95% 98% 95%  Weight:      Height:  Intake/Output Summary (Last 24 hours) at 11/01/2022 1821 Last data filed at 11/01/2022 1734 Gross per 24 hour  Intake 1109.58 ml  Output 1150 ml  Net -40.42 ml   Filed Weights   10/30/22 2036  Weight: 97.3 kg     Exam General: Alert and oriented x 3, NAD Cardiovascular: S1 S2 auscultated, no murmurs, RRR Respiratory: scattered wheezing bilateral. On 2 lit of [place] oxygen.  Gastrointestinal: Soft, nontender, nondistended, + bowel sounds Ext: no pedal edema bilaterally Neuro: AAOx3, Cr N's II- XII. Strength 5/5 upper and lower extremities bilaterally Skin: No rashes Psych: Normal affect and demeanor, alert and oriented x3    Data Reviewed:  I have personally reviewed following labs and imaging studies   CBC Lab Results  Component Value Date   WBC 9.9 10/31/2022   RBC 4.99 10/31/2022   HGB 12.1 (L) 10/31/2022   HCT 39.0 10/31/2022   MCV 78.2 (L) 10/31/2022   MCH 24.2 (L) 10/31/2022   PLT 252 10/31/2022   MCHC 31.0 10/31/2022   RDW 16.4 (H) 10/31/2022   LYMPHSABS 0.3 (L) 10/30/2022   MONOABS 0.7 10/30/2022   EOSABS 0.0 10/30/2022   BASOSABS 0.0 10/30/2022      Last metabolic panel Lab Results  Component Value Date   NA 138 10/31/2022   K 4.0 10/31/2022   CL 104 10/31/2022   CO2 25 10/31/2022   BUN 18 10/31/2022   CREATININE 1.17 10/31/2022   GLUCOSE 164 (H) 10/31/2022   GFRNONAA >60 10/31/2022   GFRAA >60 05/15/2016   CALCIUM 8.8 (L) 10/31/2022   PHOS 3.4 10/31/2022   PROT 7.6 10/31/2022   ALBUMIN 3.7 10/31/2022   BILITOT 0.4 10/31/2022   ALKPHOS 78 10/31/2022   AST 22 10/31/2022   ALT 14 10/31/2022   ANIONGAP 9 10/31/2022    CBG (last 3)  No results for input(s): "GLUCAP" in the last 72 hours.    Coagulation Profile: Recent Labs  Lab 10/30/22 2103  INR 1.1     Radiology Studies: [facility] Chest Port 1 View  Result Date: 10/30/2022 CLINICAL DATA:  Shortness of breath and cough EXAM: PORTABLE CHEST 1 VIEW COMPARISON:  05/14/2016 FINDINGS: The lungs appear clear. Cardiac and mediastinal contours normal. No pleural effusion identified. Mild degenerative right AC joint spurring. Mild midthoracic spondylosis. IMPRESSION: 1. No active cardiopulmonary disease is radiographically apparent. 2. Mild midthoracic spondylosis. Electronically Signed   By: Van Clines M.D.   On: 10/30/2022 20:48       Hosie Poisson M.D. [facility] 11/01/2022, 6:21 PM  Available via Epic secure chat 7am-7pm After 7 pm, please refer to night coverage provider listed on amion.

## 2022-11-02 DIAGNOSIS — J9601 Acute respiratory failure with hypoxia: Secondary | ICD-10-CM | POA: Diagnosis not present

## 2022-11-02 DIAGNOSIS — J441 Chronic obstructive pulmonary disease with (acute) exacerbation: Secondary | ICD-10-CM | POA: Diagnosis not present

## 2022-11-02 DIAGNOSIS — J111 Influenza due to unidentified influenza virus with other respiratory manifestations: Secondary | ICD-10-CM | POA: Diagnosis not present

## 2022-11-02 LAB — BASIC METABOLIC PANEL
Anion gap: 7 (ref 5–15)
BUN: 20 mg/dL (ref 8–23)
CO2: 27 mmol/L (ref 22–32)
Calcium: 9.1 mg/dL (ref 8.9–10.3)
Chloride: 105 mmol/L (ref 98–111)
Creatinine, Ser: 0.98 mg/dL (ref 0.61–1.24)
GFR, Estimated: >60 mL/min (ref >60)
Glucose, Bld: 147 mg/dL — ABNORMAL HIGH (ref 70–99)
Potassium: 4.1 mmol/L (ref 3.5–5.1)
Sodium: 139 mmol/L (ref 135–145)

## 2022-11-02 MED ORDER — PREDNISONE 20 MG PO TABS
40.0000 mg | ORAL_TABLET | Freq: Every day | ORAL | Status: DC
  Administered 2022-11-03: 40 mg via ORAL
  Filled 2022-11-02: qty 2

## 2022-11-02 MED ORDER — IPRATROPIUM-ALBUTEROL 0.5-2.5 (3) MG/3ML IN SOLN
3.0000 mL | Freq: Two times a day (BID) | RESPIRATORY_TRACT | Status: DC
  Administered 2022-11-02 – 2022-11-03 (×2): 3 mL via RESPIRATORY_TRACT
  Filled 2022-11-02: qty 3

## 2022-11-02 NOTE — Progress Notes (Signed)
[facility]                                                                               Frank Webb, is a 66 y.o. male, DOB - 06/24/1956, [id number] Admit date - 10/30/2022    Outpatient Primary MD for the patient is Verlon Au, MD  LOS - 2  days    Brief summary    Frank Webb is a 66 y.o. male with medical history significant for essential hypertension, hyperlipidemia, COPD, who presented to [facility] ED with complaints of nonproductive cough, nausea vomiting and diarrhea with onset in the past 24 hours.  He took over-the-counter medications without any improvement.  EMS was activated and he was brought to the ED.   In the ED, influenza A by PCR positive.  Chest x-ray nonacute.  He was started on supportive care.  Assessment & Plan    Assessment and Plan:   Acute respiratory failure with hypoxia sec to influenza A infection and COPD exacerbation Improving.   Complete the course of Tamiflu , along with steroids and duonebs.  Breathing has improved, but he reports coughing.    AKI;  From dehydration.  Improving.  Gentle hydration.  Repeat renal parameters have improved. Decreased the rate of fluids.    Tobacco use disorder - nicotine patch.    Hypertension:  BP parameters have improved.   Estimated body mass index is 30.78 kg/m as calculated from the following:   Height as of this encounter: 5\' 10"  (1.778 m).   Weight as of this encounter: 97.3 kg.  Code Status: full code.  DVT Prophylaxis:  enoxaparin (LOVENOX) injection 40 mg Start: 10/31/22 1000   Level of Care: Level of care: Telemetry Family Communication: none at bedside.   Disposition Plan:     Remains inpatient appropriate:  possible dc in am.    Procedures:  None.   Consultants:   None.   Antimicrobials:   Anti-infectives (From admission, onward)    Start     Dose/Rate Route Frequency Ordered Stop   10/31/22 0630  oseltamivir (TAMIFLU) capsule 75 mg        75 mg  Oral 2 times daily 10/31/22 0622 11/05/22 0959        Medications  Scheduled Meds:  acetaminophen  650 mg Oral Once   budesonide (PULMICORT) nebulizer solution  0.25 mg Nebulization BID   enoxaparin (LOVENOX) injection  40 mg Subcutaneous Q24H   ipratropium-albuterol  3 mL Nebulization BID   montelukast  10 mg Oral QHS   nicotine  14 mg Transdermal Daily   oseltamivir  75 mg Oral BID   Continuous Infusions:  lactated ringers 125 mL/hr at 11/02/22 0838   PRN Meds:.benzonatate, guaiFENesin-dextromethorphan    Subjective:   Frank Webb was seen and examined today.  Still coughing , but better than on admission, weaned off oxygen today.   Objective:   Vitals:   11/02/22 0522 11/02/22 0810 11/02/22 0811 11/02/22 1006  BP: (!) 143/94     Pulse: 68     Resp: 18     Temp: 97.7 F (36.5 C)     TempSrc: Oral     SpO2:  98% 96% 96% 93%  Weight:      Height:        Intake/Output Summary (Last 24 hours) at 11/02/2022 1510 Last data filed at 11/02/2022 1000 Gross per 24 hour  Intake 4385.33 ml  Output 825 ml  Net 3560.33 ml    Filed Weights   10/30/22 2036  Weight: 97.3 kg     Exam General exam: Appears calm and comfortable  Respiratory system: scattered wheezing today. On RA.  Cardiovascular system: S1 & S2 heard, RRR. No JVD, murmurs, rubs, gallops or clicks. No pedal edema. Gastrointestinal system: Abdomen is nondistended, soft and nontender. No organomegaly or masses felt. Normal bowel sounds heard. Central nervous system: Alert and oriented. No focal neurological deficits. Extremities: Symmetric 5 x 5 power. Skin: No rashes, lesions or ulcers Psychiatry: Judgement and insight appear normal. Mood & affect appropriate.     Data Reviewed:  I have personally reviewed following labs and imaging studies   CBC Lab Results  Component Value Date   WBC 9.9 10/31/2022   RBC 4.99 10/31/2022   HGB 12.1 (L) 10/31/2022   HCT 39.0 10/31/2022   MCV 78.2 (L)  10/31/2022   MCH 24.2 (L) 10/31/2022   PLT 252 10/31/2022   MCHC 31.0 10/31/2022   RDW 16.4 (H) 10/31/2022   LYMPHSABS 0.3 (L) 10/30/2022   MONOABS 0.7 10/30/2022   EOSABS 0.0 10/30/2022   BASOSABS 0.0 10/30/2022     Last metabolic panel Lab Results  Component Value Date   NA 138 10/31/2022   K 4.0 10/31/2022   CL 104 10/31/2022   CO2 25 10/31/2022   BUN 18 10/31/2022   CREATININE 1.17 10/31/2022   GLUCOSE 164 (H) 10/31/2022   GFRNONAA >60 10/31/2022   GFRAA >60 05/15/2016   CALCIUM 8.8 (L) 10/31/2022   PHOS 3.4 10/31/2022   PROT 7.6 10/31/2022   ALBUMIN 3.7 10/31/2022   BILITOT 0.4 10/31/2022   ALKPHOS 78 10/31/2022   AST 22 10/31/2022   ALT 14 10/31/2022   ANIONGAP 9 10/31/2022    CBG (last 3)  No results for input(s): "GLUCAP" in the last 72 hours.    Coagulation Profile: Recent Labs  Lab 10/30/22 2103  INR 1.1      Radiology Studies: No results found.     Kathlen Mody M.D. [facility] 11/02/2022, 3:10 PM  Available via Epic secure chat 7am-7pm After 7 pm, please refer to night coverage provider listed on amion.

## 2022-11-02 NOTE — Progress Notes (Signed)
  Transition of Care [facility]) Screening Note   Patient Details  Name: Clemon Devaul Date of Birth: 1956-01-19   Transition of Care [facility]) CM/SW Contact:    Otelia Santee, LCSW Phone Number: 11/02/2022, 1:24 PM    Transition of Care Department [facility]) has reviewed patient and no TOC needs have been identified at this time. We will continue to monitor patient advancement through interdisciplinary progression rounds. If new patient transition needs arise, please place a TOC consult.

## 2022-11-02 NOTE — Progress Notes (Signed)
Mobility Specialist - Progress Note   11/02/22 1006  Oxygen Therapy  SpO2 93 %  O2 Device Room Air  Mobility  Activity Ambulated with assistance in hallway  Level of Assistance Modified independent, requires aide device or extra time  Assistive Device None  Distance Ambulated (ft) 500 ft  Activity Response Tolerated well  Mobility Referral Yes  $Mobility charge 1 Mobility   Nurse requested Mobility Specialist to perform oxygen saturation test with pt which includes removing pt from oxygen both at rest and while ambulating.  Below are the results from that testing.     Patient Saturations on Room Air at Rest = spO2 93%  Patient Saturations on Room Air while Ambulating = sp02 92% .   At end of testing pt left in room on 0 Liters of oxygen.  Reported results to nurse.    Pt received in bed and agreed to mobility. Had no issues during mobility, pt returned to bed with all needs met.   Roderick Pee Mobility Specialist

## 2022-11-02 NOTE — Plan of Care (Signed)

## 2022-11-03 DIAGNOSIS — Z720 Tobacco use: Secondary | ICD-10-CM

## 2022-11-03 DIAGNOSIS — J9601 Acute respiratory failure with hypoxia: Secondary | ICD-10-CM | POA: Diagnosis not present

## 2022-11-03 DIAGNOSIS — J111 Influenza due to unidentified influenza virus with other respiratory manifestations: Secondary | ICD-10-CM | POA: Diagnosis not present

## 2022-11-03 DIAGNOSIS — J441 Chronic obstructive pulmonary disease with (acute) exacerbation: Secondary | ICD-10-CM | POA: Diagnosis not present

## 2022-11-03 MED ORDER — BENZONATATE 200 MG PO CAPS: 200.0000 mg | ORAL_CAPSULE | Freq: Three times a day (TID) | ORAL | 0 refills | Status: DC | PRN

## 2022-11-03 MED ORDER — MONTELUKAST SODIUM 10 MG PO TABS: 10.0000 mg | ORAL_TABLET | Freq: Every day | ORAL | 0 refills | Status: AC

## 2022-11-03 MED ORDER — NICOTINE 14 MG/24HR TD PT24: 14.0000 mg | MEDICATED_PATCH | Freq: Every day | TRANSDERMAL | 0 refills | Status: DC

## 2022-11-03 MED ORDER — OSELTAMIVIR PHOSPHATE 75 MG PO CAPS: 75.0000 mg | ORAL_CAPSULE | Freq: Two times a day (BID) | ORAL | 0 refills | Status: AC

## 2022-11-03 MED ORDER — PREDNISONE 20 MG PO TABS: 40.0000 mg | ORAL_TABLET | Freq: Every day | ORAL | 0 refills | Status: AC

## 2022-11-03 MED ORDER — GUAIFENESIN-DM 100-10 MG/5ML PO SYRP: 5.0000 mL | ORAL_SOLUTION | ORAL | 0 refills | Status: AC | PRN

## 2022-11-04 NOTE — Discharge Summary (Signed)
Physician Discharge Summary   Patient: Frank Webb MRN: [id number] DOB: 10-20-1956  Admit date:     10/30/2022  Discharge date: 11/03/22  Discharge Physician: Kathlen Mody   PCP: Verlon Au, MD   Recommendations at discharge:  Please follow up with PCP in one week.  Please follow up with CBC and BMP   Discharge Diagnoses: Principal Problem:   COPD with acute exacerbation [facility]) Active Problems:   Acute respiratory failure [facility])    Hospital Course:  Frank Webb is a 66 y.o. male with medical history significant for essential hypertension, hyperlipidemia, COPD, who presented to [facility] ED with complaints of nonproductive cough, nausea vomiting and diarrhea with onset in the past 24 hours.  He took over-the-counter medications without any improvement.  EMS was activated and he was brought to the ED.   In the ED, influenza A by PCR positive.  Chest x-ray nonacute.  He was started on supportive care. Assessment and Plan:  Acute respiratory failure with hypoxia sec to influenza A infection and COPD exacerbation Improving.   Complete the course of Tamiflu , along with steroids and duonebs.  Breathing has improved, but he reports coughing.      AKI;  From dehydration.  Improving.  Gentle hydration.  Repeat renal parameters have improved. Decreased the rate of fluids.      Tobacco use disorder - nicotine patch.      Hypertension:  BP parameters have improved.    Estimated body mass index is 30.78 kg/m as calculated from the following:   Height as of this encounter: 5\' 10"  (1.778 m).   Weight as of this encounter: 97.3 kg.       Consultants: none.  Procedures performed: none.  Disposition: Home Diet recommendation:  Discharge Diet Orders (From admission, onward)     Start     Ordered   11/03/22 0000  Diet - low sodium heart healthy        11/03/22 1008           Regular diet DISCHARGE MEDICATION: Allergies as of 11/03/2022   No Known Allergies       Medication List     STOP taking these medications    diclofenac 50 MG EC tablet Commonly known as: VOLTAREN   nitroGLYCERIN 0.4 MG SL tablet Commonly known as: NITROSTAT   spironolactone 25 MG tablet Commonly known as: ALDACTONE       TAKE these medications    amLODipine 10 MG tablet Commonly known as: NORVASC Take 1 tablet (10 mg total) by mouth daily.   aspirin 81 MG tablet Take 1 tablet (81 mg total) by mouth daily.   atorvastatin 80 MG tablet Commonly known as: LIPITOR Take 1 tablet (80 mg total) by mouth daily at 6 PM.   benzonatate 200 MG capsule Commonly known as: TESSALON Take 1 capsule (200 mg total) by mouth 3 (three) times daily as needed for cough.   carvedilol 12.5 MG tablet Commonly known as: COREG Take 1 tablet (12.5 mg total) by mouth 2 (two) times daily. What changed: how much to take   furosemide 40 MG tablet Commonly known as: Lasix Take 0.5 tablets (20 mg total) by mouth 2 (two) times daily. What changed:  how much to take when to take this   guaiFENesin-dextromethorphan 100-10 MG/5ML syrup Commonly known as: ROBITUSSIN DM Take 5 mLs by mouth every 4 (four) hours as needed for cough (chest congestion).   hydrALAZINE 100 MG tablet Commonly known as: APRESOLINE TAKE ONE  TABLET BY MOUTH THREE TIMES DAILY   ipratropium 0.06 % nasal spray Commonly known as: ATROVENT Place 2 sprays into the nose 3 (three) times daily.   losartan 100 MG tablet Commonly known as: COZAAR Take 1 tablet (100 mg total) by mouth daily.   montelukast 10 MG tablet Commonly known as: SINGULAIR Take 1 tablet (10 mg total) by mouth at bedtime.   nicotine 14 mg/24hr patch Commonly known as: NICODERM CQ - dosed in mg/24 hours Place 1 patch (14 mg total) onto the skin daily.   oseltamivir 75 MG capsule Commonly known as: TAMIFLU Take 1 capsule (75 mg total) by mouth 2 (two) times daily for 2 days.   predniSONE 20 MG tablet Commonly known as:  DELTASONE Take 2 tablets (40 mg total) by mouth daily before breakfast for 5 days.        Discharge Exam: Filed Weights   10/30/22 2036  Weight: 97.3 kg   General exam: Appears calm and comfortable  Respiratory system: Clear to auscultation. Respiratory effort normal. Cardiovascular system: S1 & S2 heard, RRR. No JVD, murmurs, rubs, gallops or clicks. No pedal edema. Gastrointestinal system: Abdomen is nondistended, soft and nontender. No organomegaly or masses felt. Normal bowel sounds heard. Central nervous system: Alert and oriented. No focal neurological deficits. Extremities: Symmetric 5 x 5 power. Skin: No rashes, lesions or ulcers Psychiatry: Judgement and insight appear normal. Mood & affect appropriate.    Condition at discharge: fair  The results of significant diagnostics from this hospitalization (including imaging, microbiology, ancillary and laboratory) are listed below for reference.   Imaging Studies: DG Chest Port 1 View  Result Date: 10/30/2022 CLINICAL DATA:  Shortness of breath and cough EXAM: PORTABLE CHEST 1 VIEW COMPARISON:  05/14/2016 FINDINGS: The lungs appear clear. Cardiac and mediastinal contours normal. No pleural effusion identified. Mild degenerative right AC joint spurring. Mild midthoracic spondylosis. IMPRESSION: 1. No active cardiopulmonary disease is radiographically apparent. 2. Mild midthoracic spondylosis. Electronically Signed   By: Van Clines M.D.   On: 10/30/2022 20:48    Microbiology: Results for orders placed or performed during the hospital encounter of 10/30/22  Urine Culture     Status: None   Collection Time: 10/30/22  9:06 AM   Specimen: In/Out Cath Urine  Result Value Ref Range Status   Specimen Description   Final    IN/OUT CATH URINE Performed at [facility], [place] [address] [place], [place] [address]    Special Requests   Final    NONE Performed at [facility], [place]  [address] [place], [place] [address]    Culture   Final    NO GROWTH Performed at [facility], [place] [address], [place], [place] [address]    Report Status 11/01/2022 FINAL  Final  Blood Culture (routine x 2)     Status: None (Preliminary result)   Collection Time: 10/30/22  9:00 PM   Specimen: BLOOD  Result Value Ref Range Status   Specimen Description   Final    BLOOD RIGHT ANTECUBITAL Performed at [place] [address], [place], [place] [address]    Special Requests   Final    BOTTLES DRAWN AEROBIC AND ANAEROBIC Blood Culture adequate volume Performed at [place] [address] [place], [place] [address]    Culture   Final    NO GROWTH 3 DAYS Performed at [facility], [place] [address] [place],  [address]  Report Status PENDING  Incomplete  Resp panel by RT-PCR (RSV, Flu A&B, Covid) Anterior Nasal Swab     Status: Abnormal   Collection Time: 10/30/22  9:41 PM   Specimen: Anterior Nasal Swab  Result Value Ref Range Status   SARS Coronavirus 2 by RT PCR NEGATIVE NEGATIVE Final    Comment: (NOTE) SARS-CoV-2 target nucleic acids are NOT DETECTED.  The SARS-CoV-2 RNA is generally detectable in upper respiratory specimens during the acute phase of infection. The lowest concentration of SARS-CoV-2 viral copies this assay can detect is 138 copies/mL. A negative result does not preclude SARS-Cov-2 infection and should not be used as the sole basis for treatment or other patient management decisions. A negative result may occur with  improper specimen collection/handling, submission of specimen other than nasopharyngeal swab, presence of viral mutation(s) within the areas targeted by this assay, and inadequate number of viral copies(<138 copies/mL). A negative result must be combined with clinical observations, patient history, and epidemiological information. The expected result is Negative.  Fact Sheet for  Patients:  EntrepreneurPulse.com.au  Fact Sheet for Healthcare Providers:  IncredibleEmployment.be  This test is no t yet approved or cleared by the [place] FDA and  has been authorized for detection and/or diagnosis of SARS-CoV-2 by FDA under an Emergency Use Authorization (EUA). This EUA will remain  in effect (meaning this test can be used) for the duration of the COVID-19 declaration under Section 564(b)(1) of the Act, 21 U.S.C.section 360bbb-3(b)(1), unless the authorization is terminated  or revoked sooner.       Influenza A by PCR POSITIVE (A) NEGATIVE Final   Influenza B by PCR NEGATIVE NEGATIVE Final    Comment: (NOTE) The Xpert Xpress SARS-CoV-2/FLU/RSV plus assay is intended as an aid in the diagnosis of influenza from Nasopharyngeal swab specimens and should not be used as a sole basis for treatment. Nasal washings and aspirates are unacceptable for Xpert Xpress SARS-CoV-2/FLU/RSV testing.  Fact Sheet for Patients: EntrepreneurPulse.com.au  Fact Sheet for Healthcare Providers: IncredibleEmployment.be  This test is not yet approved or cleared by the [place] FDA and has been authorized for detection and/or diagnosis of SARS-CoV-2 by FDA under an Emergency Use Authorization (EUA). This EUA will remain in effect (meaning this test can be used) for the duration of the COVID-19 declaration under Section 564(b)(1) of the Act, 21 U.S.C. section 360bbb-3(b)(1), unless the authorization is terminated or revoked.     Resp Syncytial Virus by PCR NEGATIVE NEGATIVE Final    Comment: (NOTE) Fact Sheet for Patients: EntrepreneurPulse.com.au  Fact Sheet for Healthcare Providers: IncredibleEmployment.be  This test is not yet approved or cleared by the [place] FDA and has been authorized for detection and/or diagnosis of SARS-CoV-2 by FDA under an  Emergency Use Authorization (EUA). This EUA will remain in effect (meaning this test can be used) for the duration of the COVID-19 declaration under Section 564(b)(1) of the Act, 21 U.S.C. section 360bbb-3(b)(1), unless the authorization is terminated or revoked.  Performed at [facility], [place] [address], [place], [place] [address]   Blood Culture (routine x 2)     Status: None (Preliminary result)   Collection Time: 10/31/22 10:23 AM   Specimen: BLOOD  Result Value Ref Range Status   Specimen Description BLOOD LEFT ANTECUBITAL  Final   Special Requests   Final    BOTTLES DRAWN AEROBIC AND ANAEROBIC Blood Culture adequate volume   Culture   Final    NO GROWTH 3 DAYS Performed  at [facility], [place] [address] [place], [place] [address]    Report Status PENDING  Incomplete    Labs: CBC: Recent Labs  Lab 10/30/22 2103 10/31/22 0715  WBC 9.9 9.9  NEUTROABS 8.9*  --   HGB 13.6 12.1*  HCT 43.2 39.0  MCV 78.3* 78.2*  PLT 322 [id number]   Basic Metabolic Panel: Recent Labs  Lab 10/30/22 2103 10/31/22 0715 11/02/22 1823  NA 138 138 139  K 3.7 4.0 4.1  CL 103 104 105  CO2 24 25 27   GLUCOSE 140* 164* 147*  BUN 19 18 20   CREATININE 1.50* 1.17 0.98  CALCIUM 9.0 8.8* 9.1  MG  --  2.2  --   PHOS  --  3.4  --    Liver Function Tests: Recent Labs  Lab 10/31/22 0715  AST 22  ALT 14  ALKPHOS 78  BILITOT 0.4  PROT 7.6  ALBUMIN 3.7   CBG: No results for input(s): "GLUCAP" in the last 168 hours.  Discharge time spent: 39 minutes;   Signed: Hosie Poisson, MD [facility] 11/04/2022

## 2022-11-05 LAB — CULTURE, BLOOD (ROUTINE X 2)
Culture: NO GROWTH
Culture: NO GROWTH
Special Requests: ADEQUATE
Special Requests: ADEQUATE

## 2023-04-24 ENCOUNTER — Emergency Department (HOSPITAL_COMMUNITY): Payer: Medicare HMO

## 2023-04-24 ENCOUNTER — Emergency Department (HOSPITAL_COMMUNITY)
Admission: EM | Admit: 2023-04-24 | Discharge: 2023-04-24 | Disposition: A | Discharge status: Home / Self Care | Payer: Medicare HMO | Attending: Emergency Medicine | Admitting: Emergency Medicine

## 2023-04-24 DIAGNOSIS — Z1152 Encounter for screening for COVID-19: Secondary | ICD-10-CM | POA: Diagnosis not present

## 2023-04-24 DIAGNOSIS — J441 Chronic obstructive pulmonary disease with (acute) exacerbation: Secondary | ICD-10-CM

## 2023-04-24 DIAGNOSIS — R0602 Shortness of breath: Secondary | ICD-10-CM | POA: Diagnosis present

## 2023-04-24 DIAGNOSIS — Z7982 Long term (current) use of aspirin: Secondary | ICD-10-CM | POA: Insufficient documentation

## 2023-04-24 LAB — CBC WITH DIFFERENTIAL/PLATELET
Abs Immature Granulocytes: 0.02 10*3/uL (ref 0.00–0.07)
Basophils Absolute: 0.1 10*3/uL (ref 0.0–0.1)
Basophils Relative: 1 %
Eosinophils Absolute: 0.9 10*3/uL — ABNORMAL HIGH (ref 0.0–0.5)
Eosinophils Relative: 11 %
HCT: 43.5 % (ref 39.0–52.0)
Hemoglobin: 13.8 g/dL (ref 13.0–17.0)
Immature Granulocytes: 0 %
Lymphocytes Relative: 20 %
Lymphs Abs: 1.8 10*3/uL (ref 0.7–4.0)
MCH: 24.2 pg — ABNORMAL LOW (ref 26.0–34.0)
MCHC: 31.7 g/dL (ref 30.0–36.0)
MCV: 76.3 fL — ABNORMAL LOW (ref 80.0–100.0)
Monocytes Absolute: 0.8 10*3/uL (ref 0.1–1.0)
Monocytes Relative: 9 %
Neutro Abs: 5.2 10*3/uL (ref 1.7–7.7)
Neutrophils Relative %: 59 %
Platelets: 337 10*3/uL (ref 150–400)
RBC: 5.7 MIL/uL (ref 4.22–5.81)
RDW: 16.3 % — ABNORMAL HIGH (ref 11.5–15.5)
WBC: 8.7 10*3/uL (ref 4.0–10.5)
nRBC: 0 % (ref 0.0–0.2)

## 2023-04-24 LAB — COMPREHENSIVE METABOLIC PANEL
ALT: 12 U/L (ref 0–44)
AST: 14 U/L — ABNORMAL LOW (ref 15–41)
Albumin: 3 g/dL — ABNORMAL LOW (ref 3.5–5.0)
Alkaline Phosphatase: 62 U/L (ref 38–126)
Anion gap: 11 (ref 5–15)
BUN: 11 mg/dL (ref 8–23)
CO2: 24 mmol/L (ref 22–32)
Calcium: 7.9 mg/dL — ABNORMAL LOW (ref 8.9–10.3)
Chloride: 107 mmol/L (ref 98–111)
Creatinine, Ser: 1.12 mg/dL (ref 0.61–1.24)
GFR, Estimated: >60 mL/min (ref >60)
Glucose, Bld: 105 mg/dL — ABNORMAL HIGH (ref 70–99)
Potassium: 3.3 mmol/L — ABNORMAL LOW (ref 3.5–5.1)
Sodium: 142 mmol/L (ref 135–145)
Total Bilirubin: 0.7 mg/dL (ref 0.3–1.2)
Total Protein: 6.8 g/dL (ref 6.5–8.1)

## 2023-04-24 LAB — RESP PANEL BY RT-PCR (RSV, FLU A&B, COVID)  RVPGX2
Influenza A by PCR: NEGATIVE
Influenza B by PCR: NEGATIVE
Resp Syncytial Virus by PCR: NEGATIVE
SARS Coronavirus 2 by RT PCR: NEGATIVE

## 2023-04-24 LAB — BLOOD GAS, VENOUS
Acid-Base Excess: 5.5 mmol/L — ABNORMAL HIGH (ref 0.0–2.0)
Bicarbonate: 31.1 mmol/L — ABNORMAL HIGH (ref 20.0–28.0)
O2 Saturation: 34.4 %
Patient temperature: 37
pCO2, Ven: 48 mmHg (ref 44–60)
pH, Ven: 7.42 (ref 7.25–7.43)
pO2, Ven: <31 mmHg — CL (ref 32–45)

## 2023-04-24 MED ORDER — SODIUM CHLORIDE 0.9 % IV BOLUS
1000.0000 mL | Freq: Once | INTRAVENOUS | Status: AC
  Administered 2023-04-24: 1000 mL via INTRAVENOUS

## 2023-04-24 MED ORDER — ALBUTEROL (5 MG/ML) CONTINUOUS INHALATION SOLN
15.0000 mg/h | INHALATION_SOLUTION | Freq: Once | RESPIRATORY_TRACT | Status: AC
  Administered 2023-04-24: 15 mg/h via RESPIRATORY_TRACT
  Filled 2023-04-24: qty 20

## 2023-04-24 MED ORDER — MAGNESIUM SULFATE 2 GM/50ML IV SOLN
2.0000 g | Freq: Once | INTRAVENOUS | Status: AC
  Administered 2023-04-24: 2 g via INTRAVENOUS
  Filled 2023-04-24: qty 50

## 2023-04-24 MED ORDER — ALBUTEROL SULFATE (2.5 MG/3ML) 0.083% IN NEBU
INHALATION_SOLUTION | RESPIRATORY_TRACT | Status: AC
  Administered 2023-04-24: 15 mg
  Filled 2023-04-24: qty 21

## 2023-04-24 MED ORDER — IPRATROPIUM BROMIDE 0.02 % IN SOLN
1.0000 mg | Freq: Once | RESPIRATORY_TRACT | Status: AC
  Administered 2023-04-24: 1 mg via RESPIRATORY_TRACT
  Filled 2023-04-24: qty 5

## 2023-04-24 MED ORDER — METHYLPREDNISOLONE 4 MG PO TBPK: ORAL_TABLET | ORAL | 0 refills | Status: DC

## 2023-04-24 MED ORDER — BENZONATATE 100 MG PO CAPS: 100.0000 mg | ORAL_CAPSULE | Freq: Three times a day (TID) | ORAL | 0 refills | Status: AC

## 2023-04-24 NOTE — ED Triage Notes (Signed)
Pt arrived via EMS, from home. Hx of COPD asthma. Worsening SOB for the last couple days. Tripod positioning on EMS arrival, wheezing in all fields. 86% RA prior to treatments.   18 G L AC Given 5mg  albuterol 0.5 atrovent 125 mg solumedrol

## 2023-04-24 NOTE — ED Provider Notes (Signed)
[place] EMERGENCY DEPARTMENT AT [facility] Provider Note   CSN: [id number] Arrival date & time: 04/24/23  1520     History  No chief complaint on file.   Frank Webb is a 67 y.o. male hx of COPD, here presenting with shortness of breath.  Patient states that he has shortness of breath for the last 2 to 3 days.  Patient states that he has history of COPD.  He states that he was admitted to the hospital for similar symptoms and was diagnosed with COPD exacerbation as well as flu.  Patient was noted to be hypoxic 89% on room air.  EMS gave him DuoNeb and also Solu-Medrol en route.  The history is provided by the patient.       Home Medications Prior to Admission medications   Medication Sig Start Date End Date Taking? Authorizing Provider  amLODipine (NORVASC) 10 MG tablet Take 1 tablet (10 mg total) by mouth daily. 05/17/16   Creig Hines, NP  aspirin 81 MG tablet Take 1 tablet (81 mg total) by mouth daily. 05/17/16   Creig Hines, NP  atorvastatin (LIPITOR) 80 MG tablet Take 1 tablet (80 mg total) by mouth daily at 6 PM. 01/14/17   Jake Bathe, MD  benzonatate (TESSALON) 200 MG capsule Take 1 capsule (200 mg total) by mouth 3 (three) times daily as needed for cough. 11/03/22   Kathlen Mody, MD  carvedilol (COREG) 12.5 MG tablet Take 1 tablet (12.5 mg total) by mouth 2 (two) times daily. Patient taking differently: Take 25 mg by mouth 2 (two) times daily. 01/14/17   Jake Bathe, MD  furosemide (LASIX) 40 MG tablet Take 0.5 tablets (20 mg total) by mouth 2 (two) times daily. Patient taking differently: Take 40 mg by mouth daily. 01/14/17   Jake Bathe, MD  guaiFENesin-dextromethorphan (ROBITUSSIN DM) 100-10 MG/5ML syrup Take 5 mLs by mouth every 4 (four) hours as needed for cough (chest congestion). 11/03/22   Kathlen Mody, MD  hydrALAZINE (APRESOLINE) 100 MG tablet TAKE ONE TABLET BY MOUTH THREE TIMES DAILY 06/24/17   Jake Bathe, MD   ipratropium (ATROVENT) 0.06 % nasal spray Place 2 sprays into the nose 3 (three) times daily. 05/29/22   [provider]  losartan (COZAAR) 100 MG tablet Take 1 tablet (100 mg total) by mouth daily. 05/17/16   Creig Hines, NP  montelukast (SINGULAIR) 10 MG tablet Take 1 tablet (10 mg total) by mouth at bedtime. 11/03/22   Kathlen Mody, MD  nicotine (NICODERM CQ - DOSED IN MG/24 HOURS) 14 mg/24hr patch Place 1 patch (14 mg total) onto the skin daily. 11/04/22   Kathlen Mody, MD      Allergies    Patient has no known allergies.    Review of Systems   Review of Systems  Respiratory:  Positive for shortness of breath.   All other systems reviewed and are negative.   Physical Exam Updated Vital Signs BP (!) 141/85 (BP Location: Right Arm)   Pulse 79   Temp 98.1 F (36.7 C) (Oral)   Resp 20   SpO2 94%  Physical Exam Vitals and nursing note reviewed.  Constitutional:      Comments: Tachypneic and mild diffuse wheezing  HENT:     Head: Normocephalic.     Nose: Nose normal.     Mouth/Throat:     Mouth: Mucous membranes are moist.  Eyes:     Extraocular Movements: Extraocular movements intact.  Pupils: Pupils are equal, round, and reactive to light.  Cardiovascular:     Rate and Rhythm: Normal rate and regular rhythm.     Pulses: Normal pulses.  Pulmonary:     Comments: Tachypneic and diffuse wheezing.  Patient sitting up talking in full sentences. Abdominal:     General: Abdomen is flat.     Palpations: Abdomen is soft.  Musculoskeletal:        General: Normal range of motion.     Cervical back: Normal range of motion and neck supple.  Skin:    General: Skin is warm.     Capillary Refill: Capillary refill takes less than 2 seconds.  Neurological:     General: No focal deficit present.     Mental Status: He is alert and oriented to person, place, and time.  Psychiatric:        Mood and Affect: Mood normal.        Behavior: Behavior normal.      ED Results / Procedures / Treatments   Labs (all labs ordered are listed, but only abnormal results are displayed) Labs Reviewed  RESP PANEL BY RT-PCR (RSV, FLU A&B, COVID)  RVPGX2  CBC WITH DIFFERENTIAL/PLATELET  COMPREHENSIVE METABOLIC PANEL  BLOOD GAS, VENOUS    EKG EKG Interpretation  Date/Time:  Saturday April 24 2023 15:35:49 EDT Ventricular Rate:  81 PR Interval:  151 QRS Duration: 145 QT Interval:  391 QTC Calculation: 457 R Axis:   75 Text Interpretation: Atrial-paced complexes Left bundle branch block No significant change since last tracing Confirmed by Richardean Canal [phone]) on 04/24/2023 3:37:42 PM  Radiology No results found.  Procedures Procedures    Medications Ordered in ED Medications  sodium chloride 0.9 % bolus 1,000 mL (has no administration in time range)  albuterol (PROVENTIL,VENTOLIN) solution continuous neb (has no administration in time range)  ipratropium (ATROVENT) nebulizer solution 1 mg (has no administration in time range)  magnesium sulfate IVPB 2 g 50 mL (has no administration in time range)    ED Course/ Medical Decision Making/ A&P                             Medical Decision Making Frank Webb is a 67 y.o. male here presenting with shortness of breath.  Patient has a history of COPD and suspect COPD exacerbation versus flu versus COVID versus pneumonia.  Plan to get CBC and CMP and VBG and COVID and flu and RSV test and chest x-ray.  Patient received DuoNeb and Solu-Medrol ready.  Will give magnesium and continuous neb and reassess.  9:22 PM I reviewed patient's labs and independently interpreted imaging study.  Patient labs are unremarkable.  VBG showed normal pH and normal CO2 level.  COVID and flu and RSV negative.  Chest x-ray did not show pneumonia.  Patient finished continuous nebs and to maintain his oxygen saturation.  When he ambulated he only desatted to about 91%.  When he is sitting there he is 93 to 95% on room air.   Patient is feeling much better now.  He has a nebulizer at home.  Will discharge home with Medrol Dosepak and cough medicine.  Problems Addressed: COPD exacerbation (HCC): acute illness or injury  Amount and/or Complexity of Data Reviewed Labs: ordered. Decision-making details documented in ED Course. Radiology: ordered and independent interpretation performed. Decision-making details documented in ED Course. ECG/medicine tests: ordered.  Risk Prescription drug management.  Final Clinical Impression(s) / ED Diagnoses Final diagnoses:  None    Rx / DC Orders ED Discharge Orders     None         Charlynne Pander, MD 04/24/23 2123

## 2023-04-24 NOTE — ED Notes (Signed)
Pt ambulated in square around nurses unit, mild drop is SPO2 readings with lowest while ambulating and directly after ambulating of 91%. Pt in no apparent significant distress and exhibit mild increase in work of breathing but quickly able to recover on his own without interventions. Pt reports breathing feels much better since he arrived earlier today, and states he does have a home nebulizer machine. Pt states he could barely take 5-6 steps from his bed to bathroom pta without being completely short of breath. Pt walked approx. 100 ft around nurses unit. Physician aware of pt progress.

## 2023-04-24 NOTE — Discharge Instructions (Signed)
You have COPD exacerbation.   Take Medrol Dosepak as prescribed  Continue to use albuterol every 4 hours as needed  Use Tessalon Perles for cough  See your doctor for follow-up  Return to ER if you have worse shortness of breath or cough or trouble breathing or fever

## 2023-05-07 ENCOUNTER — Emergency Department (HOSPITAL_COMMUNITY)
Admission: EM | Admit: 2023-05-07 | Discharge: 2023-05-07 | Disposition: A | Discharge status: Home / Self Care | Payer: Medicare HMO | Attending: Emergency Medicine | Admitting: Emergency Medicine

## 2023-05-07 ENCOUNTER — Other Ambulatory Visit: Payer: Self-pay

## 2023-05-07 ENCOUNTER — Emergency Department (HOSPITAL_COMMUNITY): Payer: Medicare HMO

## 2023-05-07 ENCOUNTER — Encounter (HOSPITAL_COMMUNITY): Payer: Self-pay

## 2023-05-07 DIAGNOSIS — Z79899 Other long term (current) drug therapy: Secondary | ICD-10-CM | POA: Insufficient documentation

## 2023-05-07 DIAGNOSIS — Z7982 Long term (current) use of aspirin: Secondary | ICD-10-CM | POA: Diagnosis not present

## 2023-05-07 DIAGNOSIS — J45909 Unspecified asthma, uncomplicated: Secondary | ICD-10-CM | POA: Diagnosis not present

## 2023-05-07 DIAGNOSIS — Z1152 Encounter for screening for COVID-19: Secondary | ICD-10-CM | POA: Insufficient documentation

## 2023-05-07 DIAGNOSIS — J441 Chronic obstructive pulmonary disease with (acute) exacerbation: Secondary | ICD-10-CM | POA: Insufficient documentation

## 2023-05-07 DIAGNOSIS — J449 Chronic obstructive pulmonary disease, unspecified: Secondary | ICD-10-CM | POA: Insufficient documentation

## 2023-05-07 DIAGNOSIS — I10 Essential (primary) hypertension: Secondary | ICD-10-CM | POA: Diagnosis not present

## 2023-05-07 DIAGNOSIS — R9389 Abnormal findings on diagnostic imaging of other specified body structures: Secondary | ICD-10-CM | POA: Diagnosis not present

## 2023-05-07 DIAGNOSIS — Z7951 Long term (current) use of inhaled steroids: Secondary | ICD-10-CM | POA: Diagnosis not present

## 2023-05-07 DIAGNOSIS — R0602 Shortness of breath: Secondary | ICD-10-CM | POA: Diagnosis present

## 2023-05-07 LAB — COMPREHENSIVE METABOLIC PANEL
ALT: 10 U/L (ref 0–44)
AST: 19 U/L (ref 15–41)
Albumin: 3.5 g/dL (ref 3.5–5.0)
Alkaline Phosphatase: 71 U/L (ref 38–126)
Anion gap: 7 (ref 5–15)
BUN: 13 mg/dL (ref 8–23)
CO2: 27 mmol/L (ref 22–32)
Calcium: 8.8 mg/dL — ABNORMAL LOW (ref 8.9–10.3)
Chloride: 101 mmol/L (ref 98–111)
Creatinine, Ser: 1.17 mg/dL (ref 0.61–1.24)
GFR, Estimated: >60 mL/min (ref >60)
Glucose, Bld: 87 mg/dL (ref 70–99)
Potassium: 4.3 mmol/L (ref 3.5–5.1)
Sodium: 135 mmol/L (ref 135–145)
Total Bilirubin: 1.3 mg/dL — ABNORMAL HIGH (ref 0.3–1.2)
Total Protein: 7.2 g/dL (ref 6.5–8.1)

## 2023-05-07 LAB — RESP PANEL BY RT-PCR (RSV, FLU A&B, COVID)  RVPGX2
Influenza A by PCR: NEGATIVE
Influenza B by PCR: NEGATIVE
Resp Syncytial Virus by PCR: NEGATIVE
SARS Coronavirus 2 by RT PCR: NEGATIVE

## 2023-05-07 LAB — BRAIN NATRIURETIC PEPTIDE: B Natriuretic Peptide: 36.4 pg/mL (ref 0.0–100.0)

## 2023-05-07 LAB — CBC WITH DIFFERENTIAL/PLATELET
Abs Immature Granulocytes: 0.02 10*3/uL (ref 0.00–0.07)
Basophils Absolute: 0 10*3/uL (ref 0.0–0.1)
Basophils Relative: 1 %
Eosinophils Absolute: 0.7 10*3/uL — ABNORMAL HIGH (ref 0.0–0.5)
Eosinophils Relative: 9 %
HCT: 41.4 % (ref 39.0–52.0)
Hemoglobin: 12.9 g/dL — ABNORMAL LOW (ref 13.0–17.0)
Immature Granulocytes: 0 %
Lymphocytes Relative: 25 %
Lymphs Abs: 2 10*3/uL (ref 0.7–4.0)
MCH: 24 pg — ABNORMAL LOW (ref 26.0–34.0)
MCHC: 31.2 g/dL (ref 30.0–36.0)
MCV: 77 fL — ABNORMAL LOW (ref 80.0–100.0)
Monocytes Absolute: 0.9 10*3/uL (ref 0.1–1.0)
Monocytes Relative: 11 %
Neutro Abs: 4.3 10*3/uL (ref 1.7–7.7)
Neutrophils Relative %: 54 %
Platelets: 352 10*3/uL (ref 150–400)
RBC: 5.38 MIL/uL (ref 4.22–5.81)
RDW: 17.3 % — ABNORMAL HIGH (ref 11.5–15.5)
WBC: 7.9 10*3/uL (ref 4.0–10.5)
nRBC: 0 % (ref 0.0–0.2)

## 2023-05-07 LAB — TROPONIN I (HIGH SENSITIVITY)
Troponin I (High Sensitivity): 11 ng/L (ref <18)
Troponin I (High Sensitivity): 11 ng/L (ref <18)

## 2023-05-07 MED ORDER — PREDNISONE 20 MG PO TABS: 40.0000 mg | ORAL_TABLET | Freq: Every day | ORAL | 0 refills | Status: DC

## 2023-05-07 MED ORDER — MAGNESIUM SULFATE 2 GM/50ML IV SOLN
2.0000 g | Freq: Once | INTRAVENOUS | Status: AC
  Administered 2023-05-07: 2 g via INTRAVENOUS
  Filled 2023-05-07: qty 50

## 2023-05-07 MED ORDER — IPRATROPIUM-ALBUTEROL 0.5-2.5 (3) MG/3ML IN SOLN
3.0000 mL | Freq: Once | RESPIRATORY_TRACT | Status: AC
  Administered 2023-05-07: 3 mL via RESPIRATORY_TRACT
  Filled 2023-05-07: qty 3

## 2023-05-07 NOTE — Discharge Instructions (Addendum)
You were seen in the ER today for evaluation of your shortness of breath and wheezing.  I am glad that you are feeling better.  I prescribed you well higher dose of prednisone for you to take daily for the next 5 days.  Please use your albuterol inhaler as needed.  I recommend she follow-up with your primary care doctor given that I think that your COPD needs better control than the inhalers you have now.  You can use your nebulizer machine every 4-6 hours as needed.  You can also take your leftover cough medication.  Additionally, there was a lung nodule seen on your chest x-ray.  I would mention this to your primary care doctor as this will need further evaluation outpatient.  If you have any concerns, new or worsening symptoms, please return to the nearest emergency department for evaluation.  Contact a doctor if: Your COPD symptoms get worse than normal. Get help right away if: You are short of breath and it gets worse, even when you are resting. You have trouble talking. You have chest pain. You cough up blood. You have a fever. You keep vomiting. You feel weak or you pass out (faint). You feel confused. You are not able to sleep because of your symptoms. You have trouble doing daily activities. These symptoms may be an emergency. Get help right away. Call your local emergency services (911 in the [place] Do not wait to see if the symptoms will go away. Do not drive yourself to the hospital.

## 2023-05-07 NOTE — ED Notes (Signed)
Pt stayed in between 94-97 on the walk

## 2023-05-07 NOTE — ED Notes (Signed)
Pt to xray via stretcher

## 2023-05-07 NOTE — ED Triage Notes (Signed)
BIB EMS from work for SOB. Used inhaler and did not have any relief. Pt received duoneb and had significant relief. Had 125 of solumedrol with EMS.  Hx of COPD and asthma

## 2023-05-07 NOTE — ED Provider Notes (Signed)
[place] EMERGENCY DEPARTMENT AT [facility] Provider Note   CSN: [id number] Arrival date & time: 05/07/23  1141     History  Chief Complaint  Patient presents with   Shortness of Breath    Frank Webb is a 67 y.o. male with history of Webb, Frank Webb, Frank presents emergency room today for evaluation of shortness of breath.  He arrived via EMS and received Solu-Medrol and DuoNeb Arrien route.  He reported this started this morning around 0930 while at work.  He reports wheezing and shortness of breath on exertion.  Does have cough without further wheezing as well.  Denies any chest pain, fevers, chills or abdominal pain, nausea, vomiting, rhinorrhea, or nasal congestion.  He reports he is on a daily inhaler however does not know the name.  He has tried his albuterol inhaler with out much relief.  He was a former tobacco user with recent quit date.  Used to smoke a pack per day for approximately 40 years.   Shortness of Breath Associated symptoms: cough and wheezing   Associated symptoms: no chest pain and no fever        Home Medications Prior to Admission medications   Medication Sig Start Date End Date Taking? Authorizing Provider  amLODipine (NORVASC) 10 MG tablet Take 1 tablet (10 mg total) by mouth daily. 05/17/16   Creig Hines, NP  aspirin 81 MG tablet Take 1 tablet (81 mg total) by mouth daily. 05/17/16   Creig Hines, NP  atorvastatin (LIPITOR) 80 MG tablet Take 1 tablet (80 mg total) by mouth daily at 6 PM. 01/14/17   Jake Bathe, MD  benzonatate (TESSALON) 100 MG capsule Take 1 capsule (100 mg total) by mouth every 8 (eight) hours. 04/24/23   Charlynne Pander, MD  carvedilol (COREG) 12.5 MG tablet Take 1 tablet (12.5 mg total) by mouth 2 (two) times daily. Patient taking differently: Take 25 mg by mouth 2 (two) times daily. 01/14/17   Jake Bathe, MD  furosemide (LASIX) 40 MG tablet Take 0.5 tablets (20 mg total) by mouth 2 (two)  times daily. Patient taking differently: Take 40 mg by mouth daily. 01/14/17   Jake Bathe, MD  guaiFENesin-dextromethorphan (ROBITUSSIN DM) 100-10 MG/5ML syrup Take 5 mLs by mouth every 4 (four) hours as needed for cough (chest congestion). 11/03/22   Kathlen Mody, MD  hydrALAZINE (APRESOLINE) 100 MG tablet TAKE ONE TABLET BY MOUTH THREE TIMES DAILY 06/24/17   Jake Bathe, MD  ipratropium (ATROVENT) 0.06 % nasal spray Place 2 sprays into the nose 3 (three) times daily. 05/29/22   [provider]  losartan (COZAAR) 100 MG tablet Take 1 tablet (100 mg total) by mouth daily. 05/17/16   Creig Hines, NP  methylPREDNISolone (MEDROL DOSEPAK) 4 MG TBPK tablet Use as directed 04/24/23   Charlynne Pander, MD  montelukast (SINGULAIR) 10 MG tablet Take 1 tablet (10 mg total) by mouth at bedtime. 11/03/22   Kathlen Mody, MD  nicotine (NICODERM CQ - DOSED IN MG/24 HOURS) 14 mg/24hr patch Place 1 patch (14 mg total) onto the skin daily. 11/04/22   Kathlen Mody, MD      Allergies    Patient has no known allergies.    Review of Systems   Review of Systems  Constitutional:  Negative for chills and fever.  HENT:  Negative for congestion and rhinorrhea.   Respiratory:  Positive for cough, shortness of breath and wheezing.   Cardiovascular:  Negative for chest pain and leg swelling.    Physical Exam Updated Vital Signs BP (!) 146/87   Pulse 78   Temp 98.5 F (36.9 C) (Oral)   Resp 19   Ht 5\' 10"  (1.778 m)   Wt 97 kg   SpO2 98%   BMI 30.68 kg/m  Physical Exam Vitals and nursing note reviewed.  Constitutional:      General: He is not in acute distress.    Appearance: Normal appearance. He is not toxic-appearing.  Eyes:     General: No scleral icterus. Cardiovascular:     Rate and Rhythm: Normal rate.  Pulmonary:     Effort: Pulmonary effort is normal. No respiratory distress.     Comments: Diffuse prolonged expiratory wheezing throughout all lung fields.  Patient is  speaking in full sentences and satting well on room air without any increased work of breathing.  No respiratory distress, nasal flaring, cyanosis, or tripoding present. Abdominal:     Palpations: Abdomen is soft.  Musculoskeletal:     Right lower leg: No tenderness. No edema.     Left lower leg: No tenderness. No edema.  Skin:    General: Skin is dry.     Findings: No rash.  Neurological:     General: No focal deficit present.     Mental Status: He is alert. Mental status is at baseline.  Psychiatric:        Mood and Affect: Mood normal.     ED Results / Procedures / Treatments   Labs (all labs ordered are listed, but only abnormal results are displayed) Labs Reviewed  CBC WITH DIFFERENTIAL/PLATELET - Abnormal; Notable for the following components:      Result Value   Hemoglobin 12.9 (*)    MCV 77.0 (*)    MCH 24.0 (*)    RDW 17.3 (*)    Eosinophils Absolute 0.7 (*)    All other components within normal limits  COMPREHENSIVE METABOLIC PANEL - Abnormal; Notable for the following components:   Calcium 8.8 (*)    Total Bilirubin 1.3 (*)    All other components within normal limits  RESP PANEL BY RT-PCR (RSV, FLU A&B, COVID)  RVPGX2  BRAIN NATRIURETIC PEPTIDE  TROPONIN I (HIGH SENSITIVITY)  TROPONIN I (HIGH SENSITIVITY)    EKG EKG Interpretation  Date/Time:  Friday May 07 2023 11:58:44 EDT Ventricular Rate:  74 PR Interval:  166 QRS Duration: 91 QT Interval:  385 QTC Calculation: 428 R Axis:   97 Text Interpretation: Sinus rhythm Anterior infarct, old No significant change since last tracing Confirmed by Alvira Monday ([id number]) on 05/07/2023 12:08:12 PM  Radiology DG Chest 2 View  Result Date: 05/07/2023 CLINICAL DATA:  Provided history: Shortness of breath. EXAM: CHEST - 2 VIEW COMPARISON:  Prior chest radiographs 04/24/2023 and earlier FINDINGS: Heart size within normal limits. An 8 mm nodular opacity projects in the region of the inferior left lung (and anterior  left sixth rib) on the PA radiograph. Elsewhere, there is no evidence of airspace consolidation. No evidence of pleural effusion or pneumothorax. No acute osseous abnormality identified. Degenerative changes of the spine. IMPRESSION: 1. 8 mm nodular opacity projecting in the region of the inferior left lung (and anterior left sixth rib) on the PA radiograph. This may reflect a pulmonary nodule, sclerotic rib lesion or asymmetric nipple shadow. A chest CT is recommended for further evaluation. 2. Otherwise, no evidence of an acute cardiopulmonary abnormality. Electronically Signed   By: Jackey Loge  D.O.   On: 05/07/2023 15:12    Procedures Procedures   Medications Ordered in ED Medications  ipratropium-albuterol (DUONEB) 0.5-2.5 (3) MG/3ML nebulizer solution 3 mL (3 mLs Nebulization Given 05/07/23 1248)  magnesium sulfate IVPB 2 g 50 mL (0 g Intravenous Stopped 05/07/23 1322)  ipratropium-albuterol (DUONEB) 0.5-2.5 (3) MG/3ML nebulizer solution 3 mL (3 mLs Nebulization Given 05/07/23 1634)    ED Course/ Medical Decision Making/ A&P Clinical Course as of 05/07/23 1759  Fri May 07, 2023  1625 Spoke with Dr. Renette Butters regarding imaging findings. He thinks that this patient can follow up outpatient for CT and does nothing think the nodule needs to be evaluated emergently  [RR]    Clinical Course User Index [RR] Achille Rich, PA-C   Medical Decision Making Amount and/or Complexity of Data Reviewed Labs: ordered. Radiology: ordered.  Risk Prescription drug management.   67 y.o. male presents to the ER for evaluation of shortness of breath and wheezing. Differential diagnosis includes but is not limited to CHF, pericardial effusion/tamponade, arrhythmias, ACS, Frank Webb, Webb, bronchitis, pneumonia, pneumothorax, PE, anemia. Vital signs unremarkable. Physical exam as noted above.   Patient was ordered DuoNebs and mag.  I independently reviewed and interpreted the patient's labs.  CBC shows very  mild anemia.  No leukocytosis however does have an elevated eosinophil count.  CMP shows mild decrease calcium 8.8.  Total bili at 1.3.  Otherwise, no electrolyte or LFT abnormality.  BNP within normal range.  COVID, flu, RSV negative.  Troponin at 11 with repeat at 11.  EKG reviewed interpreted by an attending and read as NSR.  XR shows 1. 8 mm nodular opacity projecting in the region of the inferior left lung (and anterior left sixth rib) on the PA radiograph. This may reflect a pulmonary nodule, sclerotic rib lesion or asymmetric nipple shadow. A chest CT is recommended for further evaluation. 2. Otherwise, no evidence of an acute cardiopulmonary abnormality.  On reevaluation, after the DuoNeb and magnesium, he is feeling better, lung sounds are still coarse, will order another DuoNeb.  On reevaluation, patient's lung sounds have improved to the 2 DuoNebs and magnesium however does still have some wheezing/rhonchi in the lower bases.  Patient reports he is feeling much better like to go home at this time.  I did offer him another breathing treatment however he declines.  Likely Frank Webb exacerbation.  I see the patient was recently seen here and given prednisone.  He reports that he was feeling better however while at work he started to feel worse when he was moving around.  I have a lower suspicion for any pulmonary embolism given that rhonchorous lung sounds he was having an improvement with DuoNebs and magnesium.  His troponins are within normal limit.  Will discharge him home with prednisone burst.  I encouraged him to follow-up with his PCP as he may need a stronger daily inhaler.  I discussed with him about the 8 mm lung nodule seen on his exam as well.  He reports that he thinks his PCP is already scheduled him for evaluation of this.  I asked that he follow-up just to make sure.  Overall, he reports he feels much better month go home.  We discussed the results of the labs/imaging. The plan is  follow-up with PCP, take prednisone as prescribed, use nebulizer treatments as needed. We discussed strict return precautions and red flag symptoms. The patient verbalized their understanding and agrees to the plan. The patient is stable and being discharged  home in good condition.  Portions of this report may have been transcribed using voice recognition software. Every effort was made to ensure accuracy; however, inadvertent computerized transcription errors may be present.   Final Clinical Impression(s) / ED Diagnoses Final diagnoses:  Frank Webb exacerbation (HCC)  Abnormal finding on CT scan    Rx / DC Orders ED Discharge Orders          Ordered    predniSONE (DELTASONE) 20 MG tablet  Daily,   Status:  Discontinued        05/07/23 1801    predniSONE (DELTASONE) 20 MG tablet  Daily        05/07/23 1824              Achille Rich, PA-C 05/12/23 1121    Wynetta Fines, MD 05/17/23 [phone]

## 2023-07-27 ENCOUNTER — Encounter (HOSPITAL_COMMUNITY): Payer: Self-pay | Admitting: Emergency Medicine

## 2023-07-27 ENCOUNTER — Emergency Department (HOSPITAL_COMMUNITY): Payer: Medicare HMO

## 2023-07-27 ENCOUNTER — Inpatient Hospital Stay (HOSPITAL_COMMUNITY)
Admission: EM | Admit: 2023-07-27 | Discharge: 2023-07-30 | DRG: 190 | Disposition: A | Discharge status: Home / Self Care | Payer: Medicare HMO | Attending: Family Medicine | Admitting: Family Medicine

## 2023-07-27 ENCOUNTER — Other Ambulatory Visit: Payer: Self-pay

## 2023-07-27 DIAGNOSIS — I255 Ischemic cardiomyopathy: Secondary | ICD-10-CM | POA: Diagnosis present

## 2023-07-27 DIAGNOSIS — Z955 Presence of coronary angioplasty implant and graft: Secondary | ICD-10-CM

## 2023-07-27 DIAGNOSIS — R0902 Hypoxemia: Secondary | ICD-10-CM | POA: Diagnosis not present

## 2023-07-27 DIAGNOSIS — I252 Old myocardial infarction: Secondary | ICD-10-CM

## 2023-07-27 DIAGNOSIS — I110 Hypertensive heart disease with heart failure: Secondary | ICD-10-CM | POA: Diagnosis present

## 2023-07-27 DIAGNOSIS — N179 Acute kidney failure, unspecified: Secondary | ICD-10-CM | POA: Diagnosis present

## 2023-07-27 DIAGNOSIS — D72829 Elevated white blood cell count, unspecified: Secondary | ICD-10-CM | POA: Diagnosis not present

## 2023-07-27 DIAGNOSIS — Z8249 Family history of ischemic heart disease and other diseases of the circulatory system: Secondary | ICD-10-CM

## 2023-07-27 DIAGNOSIS — Z87891 Personal history of nicotine dependence: Secondary | ICD-10-CM

## 2023-07-27 DIAGNOSIS — E860 Dehydration: Secondary | ICD-10-CM | POA: Diagnosis present

## 2023-07-27 DIAGNOSIS — Z7952 Long term (current) use of systemic steroids: Secondary | ICD-10-CM

## 2023-07-27 DIAGNOSIS — Z1152 Encounter for screening for COVID-19: Secondary | ICD-10-CM

## 2023-07-27 DIAGNOSIS — Z79899 Other long term (current) drug therapy: Secondary | ICD-10-CM

## 2023-07-27 DIAGNOSIS — E669 Obesity, unspecified: Secondary | ICD-10-CM | POA: Diagnosis present

## 2023-07-27 DIAGNOSIS — I5042 Chronic combined systolic (congestive) and diastolic (congestive) heart failure: Secondary | ICD-10-CM | POA: Diagnosis present

## 2023-07-27 DIAGNOSIS — E785 Hyperlipidemia, unspecified: Secondary | ICD-10-CM | POA: Diagnosis present

## 2023-07-27 DIAGNOSIS — I2510 Atherosclerotic heart disease of native coronary artery without angina pectoris: Secondary | ICD-10-CM | POA: Diagnosis present

## 2023-07-27 DIAGNOSIS — Z833 Family history of diabetes mellitus: Secondary | ICD-10-CM

## 2023-07-27 DIAGNOSIS — Z6830 Body mass index (BMI) 30.0-30.9, adult: Secondary | ICD-10-CM

## 2023-07-27 DIAGNOSIS — J441 Chronic obstructive pulmonary disease with (acute) exacerbation: Secondary | ICD-10-CM | POA: Diagnosis not present

## 2023-07-27 DIAGNOSIS — Z7982 Long term (current) use of aspirin: Secondary | ICD-10-CM

## 2023-07-27 DIAGNOSIS — J9601 Acute respiratory failure with hypoxia: Secondary | ICD-10-CM | POA: Diagnosis present

## 2023-07-27 DIAGNOSIS — T380X5A Adverse effect of glucocorticoids and synthetic analogues, initial encounter: Secondary | ICD-10-CM | POA: Diagnosis not present

## 2023-07-27 LAB — CBC
HCT: 47.5 % (ref 39.0–52.0)
Hemoglobin: 14.5 g/dL (ref 13.0–17.0)
MCH: 23.8 pg — ABNORMAL LOW (ref 26.0–34.0)
MCHC: 30.5 g/dL (ref 30.0–36.0)
MCV: 78 fL — ABNORMAL LOW (ref 80.0–100.0)
Platelets: 295 10*3/uL (ref 150–400)
RBC: 6.09 MIL/uL — ABNORMAL HIGH (ref 4.22–5.81)
RDW: 17.2 % — ABNORMAL HIGH (ref 11.5–15.5)
WBC: 10.9 10*3/uL — ABNORMAL HIGH (ref 4.0–10.5)
nRBC: 0 % (ref 0.0–0.2)

## 2023-07-27 LAB — BASIC METABOLIC PANEL
Anion gap: 11 (ref 5–15)
BUN: 21 mg/dL (ref 8–23)
CO2: 26 mmol/L (ref 22–32)
Calcium: 9.5 mg/dL (ref 8.9–10.3)
Chloride: 103 mmol/L (ref 98–111)
Creatinine, Ser: 1.42 mg/dL — ABNORMAL HIGH (ref 0.61–1.24)
GFR, Estimated: 54 mL/min — ABNORMAL LOW (ref >60)
Glucose, Bld: 113 mg/dL — ABNORMAL HIGH (ref 70–99)
Potassium: 3.7 mmol/L (ref 3.5–5.1)
Sodium: 140 mmol/L (ref 135–145)

## 2023-07-27 LAB — RESP PANEL BY RT-PCR (RSV, FLU A&B, COVID)  RVPGX2
Influenza A by PCR: NEGATIVE
Influenza B by PCR: NEGATIVE
Resp Syncytial Virus by PCR: NEGATIVE
SARS Coronavirus 2 by RT PCR: NEGATIVE

## 2023-07-27 LAB — BRAIN NATRIURETIC PEPTIDE: B Natriuretic Peptide: 30.9 pg/mL (ref 0.0–100.0)

## 2023-07-27 LAB — HIV ANTIBODY (ROUTINE TESTING W REFLEX): HIV Screen 4th Generation wRfx: NONREACTIVE

## 2023-07-27 MED ORDER — BUDESONIDE 0.25 MG/2ML IN SUSP
0.2500 mg | Freq: Two times a day (BID) | RESPIRATORY_TRACT | Status: DC
  Administered 2023-07-27 – 2023-07-30 (×6): 0.25 mg via RESPIRATORY_TRACT
  Filled 2023-07-27 (×6): qty 2

## 2023-07-27 MED ORDER — AZITHROMYCIN 250 MG PO TABS
500.0000 mg | ORAL_TABLET | Freq: Once | ORAL | Status: AC
  Administered 2023-07-27: 500 mg via ORAL
  Filled 2023-07-27: qty 2

## 2023-07-27 MED ORDER — ENOXAPARIN SODIUM 40 MG/0.4ML IJ SOSY
40.0000 mg | PREFILLED_SYRINGE | INTRAMUSCULAR | Status: DC
  Administered 2023-07-27 – 2023-07-29 (×3): 40 mg via SUBCUTANEOUS
  Filled 2023-07-27 (×3): qty 0.4

## 2023-07-27 MED ORDER — HYDROCODONE BIT-HOMATROP MBR 5-1.5 MG/5ML PO SOLN
5.0000 mL | ORAL | Status: DC | PRN
  Administered 2023-07-27 – 2023-07-30 (×5): 5 mL via ORAL
  Filled 2023-07-27 (×6): qty 5

## 2023-07-27 MED ORDER — IPRATROPIUM-ALBUTEROL 0.5-2.5 (3) MG/3ML IN SOLN
3.0000 mL | Freq: Once | RESPIRATORY_TRACT | Status: AC
  Administered 2023-07-27: 3 mL via RESPIRATORY_TRACT

## 2023-07-27 MED ORDER — IPRATROPIUM-ALBUTEROL 0.5-2.5 (3) MG/3ML IN SOLN
3.0000 mL | Freq: Once | RESPIRATORY_TRACT | Status: AC
  Filled 2023-07-27: qty 3

## 2023-07-27 MED ORDER — ALBUTEROL SULFATE (2.5 MG/3ML) 0.083% IN NEBU: 2.5000 mg | INHALATION_SOLUTION | RESPIRATORY_TRACT | Status: DC | PRN

## 2023-07-27 MED ORDER — REVEFENACIN 175 MCG/3ML IN SOLN
175.0000 ug | Freq: Every day | RESPIRATORY_TRACT | Status: DC
  Administered 2023-07-28 – 2023-07-30 (×3): 175 ug via RESPIRATORY_TRACT
  Filled 2023-07-27 (×4): qty 3

## 2023-07-27 MED ORDER — DOXYCYCLINE HYCLATE 100 MG PO TABS
100.0000 mg | ORAL_TABLET | Freq: Two times a day (BID) | ORAL | Status: DC
  Administered 2023-07-27 – 2023-07-30 (×6): 100 mg via ORAL
  Filled 2023-07-27 (×6): qty 1

## 2023-07-27 MED ORDER — IPRATROPIUM-ALBUTEROL 0.5-2.5 (3) MG/3ML IN SOLN
RESPIRATORY_TRACT | Status: AC
  Administered 2023-07-27: 3 mL via RESPIRATORY_TRACT
  Filled 2023-07-27: qty 3

## 2023-07-27 MED ORDER — SODIUM CHLORIDE 0.9 % IV SOLN: INTRAVENOUS | Status: AC

## 2023-07-27 MED ORDER — PREDNISONE 20 MG PO TABS: 40.0000 mg | ORAL_TABLET | Freq: Every day | ORAL | Status: DC

## 2023-07-27 MED ORDER — ARFORMOTEROL TARTRATE 15 MCG/2ML IN NEBU
15.0000 ug | INHALATION_SOLUTION | Freq: Two times a day (BID) | RESPIRATORY_TRACT | Status: DC
  Administered 2023-07-27 – 2023-07-30 (×6): 15 ug via RESPIRATORY_TRACT
  Filled 2023-07-27 (×6): qty 2

## 2023-07-27 MED ORDER — METHYLPREDNISOLONE SODIUM SUCC 125 MG IJ SOLR
125.0000 mg | Freq: Once | INTRAMUSCULAR | Status: AC
  Administered 2023-07-27: 125 mg via INTRAVENOUS
  Filled 2023-07-27: qty 2

## 2023-07-27 MED ORDER — METHYLPREDNISOLONE SODIUM SUCC 125 MG IJ SOLR
80.0000 mg | Freq: Two times a day (BID) | INTRAMUSCULAR | Status: DC
  Administered 2023-07-27 – 2023-07-28 (×2): 80 mg via INTRAVENOUS
  Filled 2023-07-27 (×2): qty 2

## 2023-07-27 NOTE — ED Provider Notes (Signed)
[place] EMERGENCY DEPARTMENT AT [facility] Provider Note   CSN: [id number] Arrival date & time: 07/27/23  1017     History  Chief Complaint  Patient presents with   Shortness of Breath    Frank Webb is a 67 y.o. male.  67 year old male with past medical history of COPD, hypertension, and hyperlipidemia presenting to the emergency department today with cough and shortness of breath.  The patient states this been going now for the past few days.  He states that over the weekend he started having a cough and some shortness of breath.  Reports this got a lot worse this morning.  He states that he has been trying to use his home albuterol with minimal improvement.  The patient did receive 2 nebulizer treatments with medics and thinks this helped a little.  He denies any associated chest pain.  He is having some nasal congestion with this as well.  He denies any abdominal pain, nausea, or vomiting.   Shortness of Breath Associated symptoms: cough        Home Medications Prior to Admission medications   Medication Sig Start Date End Date Taking? Authorizing Provider  amLODipine (NORVASC) 10 MG tablet Take 1 tablet (10 mg total) by mouth daily. 05/17/16   Creig Hines, NP  aspirin 81 MG tablet Take 1 tablet (81 mg total) by mouth daily. 05/17/16   Creig Hines, NP  atorvastatin (LIPITOR) 80 MG tablet Take 1 tablet (80 mg total) by mouth daily at 6 PM. 01/14/17   Jake Bathe, MD  benzonatate (TESSALON) 100 MG capsule Take 1 capsule (100 mg total) by mouth every 8 (eight) hours. 04/24/23   Charlynne Pander, MD  carvedilol (COREG) 12.5 MG tablet Take 1 tablet (12.5 mg total) by mouth 2 (two) times daily. Patient taking differently: Take 25 mg by mouth 2 (two) times daily. 01/14/17   Jake Bathe, MD  furosemide (LASIX) 40 MG tablet Take 0.5 tablets (20 mg total) by mouth 2 (two) times daily. Patient taking differently: Take 40 mg by mouth daily. 01/14/17    Jake Bathe, MD  guaiFENesin-dextromethorphan (ROBITUSSIN DM) 100-10 MG/5ML syrup Take 5 mLs by mouth every 4 (four) hours as needed for cough (chest congestion). 11/03/22   Kathlen Mody, MD  hydrALAZINE (APRESOLINE) 100 MG tablet TAKE ONE TABLET BY MOUTH THREE TIMES DAILY 06/24/17   Jake Bathe, MD  ipratropium (ATROVENT) 0.06 % nasal spray Place 2 sprays into the nose 3 (three) times daily. 05/29/22   [provider]  losartan (COZAAR) 100 MG tablet Take 1 tablet (100 mg total) by mouth daily. 05/17/16   Creig Hines, NP  montelukast (SINGULAIR) 10 MG tablet Take 1 tablet (10 mg total) by mouth at bedtime. 11/03/22   Kathlen Mody, MD  nicotine (NICODERM CQ - DOSED IN MG/24 HOURS) 14 mg/24hr patch Place 1 patch (14 mg total) onto the skin daily. 11/04/22   Kathlen Mody, MD  predniSONE (DELTASONE) 20 MG tablet Take 2 tablets (40 mg total) by mouth daily. 05/07/23   Achille Rich, PA-C      Allergies    Patient has no known allergies.    Review of Systems   Review of Systems  Respiratory:  Positive for cough and shortness of breath.   All other systems reviewed and are negative.   Physical Exam Updated Vital Signs BP (!) 157/94 (BP Location: Right Arm)   Pulse (!) 418   Temp 98 F (  36.7 C) (Oral)   Resp 19   Ht 5\' 10"  (1.778 m)   Wt 97 kg   SpO2 92%   BMI 30.68 kg/m  Physical Exam Vitals and nursing note reviewed.   Gen: Conversational dyspnea noted Eyes: PERRL, EOMI HEENT: no oropharyngeal swelling Neck: trachea midline Resp: diminished with scattered wheezes throughout all lung fields Card: RRR, no murmurs, rubs, or gallops Abd: nontender, nondistended Extremities: no calf tenderness, no edema Vascular: 2+ radial pulses bilaterally, 2+ DP pulses bilaterally Skin: no rashes Psyc: acting appropriately   ED Results / Procedures / Treatments   Labs (all labs ordered are listed, but only abnormal results are displayed) Labs Reviewed  BASIC  METABOLIC PANEL - Abnormal; Notable for the following components:      Result Value   Glucose, Bld 113 (*)    Creatinine, Ser 1.42 (*)    GFR, Estimated 54 (*)    All other components within normal limits  RESP PANEL BY RT-PCR (RSV, FLU A&B, COVID)  [id number]  BRAIN NATRIURETIC PEPTIDE    EKG None  Radiology DG Chest 2 View  Result Date: 07/27/2023 CLINICAL DATA:  Shortness of breath. EXAM: CHEST - 2 VIEW COMPARISON:  05/07/2023 FINDINGS: The heart size and mediastinal contours are within normal limits. Both lungs are clear. The visualized skeletal structures are unremarkable. IMPRESSION: No active cardiopulmonary disease. Electronically Signed   By: Signa Kell M.D.   On: 07/27/2023 13:17    Procedures Procedures    Medications Ordered in ED Medications  methylPREDNISolone sodium succinate (SOLU-MEDROL) 125 mg/2 mL injection 125 mg (125 mg Intravenous Given 07/27/23 1057)  ipratropium-albuterol (DUONEB) 0.5-2.5 (3) MG/3ML nebulizer solution 3 mL (3 mLs Nebulization Given 07/27/23 1128)  ipratropium-albuterol (DUONEB) 0.5-2.5 (3) MG/3ML nebulizer solution 3 mL (3 mLs Nebulization Given 07/27/23 1056)  azithromycin (ZITHROMAX) tablet 500 mg (500 mg Oral Given 07/27/23 1058)    ED Course/ Medical Decision Making/ A&P                                 Medical Decision Making 67 year old male with past medical history of COPD, hypertension, and hyperlipidemia presenting to the emergency department today with cough and shortness of breath.  The patient is hypoxic here on arrival.  I will give the patient Solu-Medrol here as well as azithromycin and additional DuoNeb's.  He is already received 2 and route.  I will give him 2 more here.  Patient does have significant wheezing on exam and this certainly does seem consistent with COPD exacerbation but I will further evaluate here with an x-ray to evaluate for pneumonia.  Will broaden his coverage as indicated.  Will reevaluate for ultimate  disposition.  The patient's chest x-ray interpreted by me is clear.  His labs are reassuring.  Despite this the patient remained on oxygen.  His ambulatory pulse ox did drop to the mid to low 80s and he was placed back on 2 L nasal cannula.  Calls placed to hospitalist service for admission.  Amount and/or Complexity of Data Reviewed Labs: ordered. Radiology: ordered.  Risk Prescription drug management. Decision regarding hospitalization.          Final Clinical Impression(s) / ED Diagnoses Final diagnoses:  COPD exacerbation (HCC)  Hypoxia    Rx / DC Orders ED Discharge Orders     None         Durwin Glaze, MD 07/27/23 1411

## 2023-07-27 NOTE — ED Notes (Addendum)
Pt ambulated in hall to check his pulse oximetry, standing sats at 88% on RA then dropped to 86% RA while ambulating, once we got back to room he was super [facility] and uncomfortable, his sats dropped to 83%, placed pt back on 2L [place]. MD aware.

## 2023-07-27 NOTE — ED Notes (Signed)
ED TO INPATIENT HANDOFF REPORT  ED Nurse Name and Phone #: Rebecca Eaton RN  S Name/Age/Gender Frank Webb 67 y.o. male Room/Bed: WA05/WA05  Code Status   Code Status: Full Code  Home/SNF/Other Home Patient oriented to: self, place, time, and situation Is this baseline? Yes   Triage Complete: Triage complete  Chief Complaint COPD with acute exacerbation (HCC) [J44.1]  Triage Note Pt BIBA from home c/o [facility] that began yesterday and worsened this morning. Pt reports productive cough. Hx of COPD, used his inhaler at home with no improvement. SpO2 91% RA. Two duonebs given enroute. Denies chest pain or dizziness.   Allergies No Known Allergies  Level of Care/Admitting Diagnosis ED Disposition     ED Disposition  Admit   Condition  --   Comment  Hospital Area: [facility] [[id number]]  Level of Care: Telemetry [5]  Admit to tele based on following criteria: Other see comments  Comments: tachycardia  May place patient in observation at [facility] or [facility] if equivalent level of care is available:: No  Covid Evaluation: Confirmed COVID Negative  Diagnosis: COPD with acute exacerbation [facility]) [[id number]]  Admitting Physician: Lanae Boast [[id number]]  Attending Physician: Lanae Boast [[id number]]          B Medical/Surgery History Past Medical History:  Diagnosis Date   Cardiomyopathy, ischemic    a. EF 35% in 02/2010 prior to PCI;  b. echo 07/2010: Moderate LVH, EF 45-50%, mild posterior HK, severe inferior HK, grade 1 diastolic dysfunction, mild MR, mild LAE; c. 04/2016 Echo: EF 55-60%, Gr2 DD, mild MR.   Chronic combined systolic (congestive) and diastolic (congestive) heart failure (HCC)    a. EF prev 35% in 2011-->55-60% 04/2016 echo.   Coronary artery disease    a. 02/2010 inferior MI -> Cath: Serial proximal, mid and distal LAD 40%, apical LAD 60%, dCFX serial 70%, OM2 with chronic occlusion, small oOM3 80%, pRCA 40%, mRCA 80%, dRCA 80%, PDA serial 70%,  EF 35%.  PCI: Overlapping BMS x 2 (2.75 x 32 mm Veriflex and 2.75 x 16 mm Veriflex) to RCA; c. 05/2016 MV: inf/lat infarct w/o ischemia. EF 44%.   Epistaxis, recurrent    "related to blood thinners; didn't last for long" (05/14/2016)   Fatigue    beta blocker d/c'd   Hyperlipidemia    Hypertension    Noncompliance    Syncope    a. negative EP study 02/2010   Past Surgical History:  Procedure Laterality Date   CARDIAC CATHETERIZATION  03/10/2010   This study demonstrates no inducible VT or inducible SVT in a patient with ischemic heart disease, mild LV dysfunction (normal echo, 35% by cath with prior stenting to the RCA)  --     CORONARY ANGIOPLASTY WITH STENT PLACEMENT  03/06/2010    drug-eluting stents in the RCA     A IV Location/Drains/Wounds Patient Lines/Drains/Airways Status     Active Line/Drains/Airways     Name Placement date Placement time Site Days   Peripheral IV 07/27/23 20 G Left Antecubital 07/27/23  1049  Antecubital  less than 1            Intake/Output Last 24 hours No intake or output data in the 24 hours ending 07/27/23 1527  Labs/Imaging Results for orders placed or performed during the hospital encounter of 07/27/23 (from the past 48 hour(s))  Basic metabolic panel     Status: Abnormal   Collection Time: 07/27/23 10:42 AM  Result Value Ref Range  Sodium 140 135 - 145 mmol/L   Potassium 3.7 3.5 - 5.1 mmol/L   Chloride 103 98 - 111 mmol/L   CO2 26 22 - 32 mmol/L   Glucose, Bld 113 (H) 70 - 99 mg/dL    Comment: Glucose reference range applies only to samples taken after fasting for at least 8 hours.   BUN 21 8 - 23 mg/dL   Creatinine, Ser 1.61 (H) 0.61 - 1.24 mg/dL   Calcium 9.5 8.9 - 09.6 mg/dL   GFR, Estimated 54 (L) >60 mL/min    Comment: (NOTE) Calculated using the CKD-EPI Creatinine Equation (2021)    Anion gap 11 5 - 15    Comment: Performed at [facility], [address], [place], [place] [address]  Brain natriuretic  peptide     Status: None   Collection Time: 07/27/23 10:42 AM  Result Value Ref Range   B Natriuretic Peptide 30.9 0.0 - 100.0 pg/mL    Comment: Performed at [facility], [address], [place], [place] [address]  Resp panel by RT-PCR (RSV, Flu A&B, Covid) Anterior Nasal Swab     Status: None   Collection Time: 07/27/23 10:46 AM   Specimen: Anterior Nasal Swab  Result Value Ref Range   SARS Coronavirus 2 by RT PCR NEGATIVE NEGATIVE    Comment: (NOTE) SARS-CoV-2 target nucleic acids are NOT DETECTED.  The SARS-CoV-2 RNA is generally detectable in upper respiratory specimens during the acute phase of infection. The lowest concentration of SARS-CoV-2 viral copies this assay can detect is 138 copies/mL. A negative result does not preclude SARS-Cov-2 infection and should not be used as the sole basis for treatment or other patient management decisions. A negative result may occur with  improper specimen collection/handling, submission of specimen other than nasopharyngeal swab, presence of viral mutation(s) within the areas targeted by this assay, and inadequate number of viral copies(<138 copies/mL). A negative result must be combined with clinical observations, patient history, and epidemiological information. The expected result is Negative.  Fact Sheet for Patients:  BloggerCourse.com  Fact Sheet for Healthcare Providers:  SeriousBroker.it  This test is no t yet approved or cleared by the [place] FDA and  has been authorized for detection and/or diagnosis of SARS-CoV-2 by FDA under an Emergency Use Authorization (EUA). This EUA will remain  in effect (meaning this test can be used) for the duration of the COVID-19 declaration under Section 564(b)(1) of the Act, 21 U.S.C.section 360bbb-3(b)(1), unless the authorization is terminated  or revoked sooner.       Influenza A by PCR NEGATIVE NEGATIVE    Influenza B by PCR NEGATIVE NEGATIVE    Comment: (NOTE) The Xpert Xpress SARS-CoV-2/FLU/RSV plus assay is intended as an aid in the diagnosis of influenza from Nasopharyngeal swab specimens and should not be used as a sole basis for treatment. Nasal washings and aspirates are unacceptable for Xpert Xpress SARS-CoV-2/FLU/RSV testing.  Fact Sheet for Patients: BloggerCourse.com  Fact Sheet for Healthcare Providers: SeriousBroker.it  This test is not yet approved or cleared by the [place] FDA and has been authorized for detection and/or diagnosis of SARS-CoV-2 by FDA under an Emergency Use Authorization (EUA). This EUA will remain in effect (meaning this test can be used) for the duration of the COVID-19 declaration under Section 564(b)(1) of the Act, 21 U.S.C. section 360bbb-3(b)(1), unless the authorization is terminated or revoked.     Resp Syncytial Virus by PCR NEGATIVE NEGATIVE    Comment: (NOTE)  Fact Sheet for Patients: BloggerCourse.com  Fact Sheet for Healthcare Providers: SeriousBroker.it  This test is not yet approved or cleared by the [place] FDA and has been authorized for detection and/or diagnosis of SARS-CoV-2 by FDA under an Emergency Use Authorization (EUA). This EUA will remain in effect (meaning this test can be used) for the duration of the COVID-19 declaration under Section 564(b)(1) of the Act, 21 U.S.C. section 360bbb-3(b)(1), unless the authorization is terminated or revoked.  Performed at [facility], [address] [place], [place] [address]    DG Chest 2 View  Result Date: 07/27/2023 CLINICAL DATA:  Shortness of breath. EXAM: CHEST - 2 VIEW COMPARISON:  05/07/2023 FINDINGS: The heart size and mediastinal contours are within normal limits. Both lungs are clear. The visualized skeletal structures are unremarkable.  IMPRESSION: No active cardiopulmonary disease. Electronically Signed   By: Signa Kell M.D.   On: 07/27/2023 13:17    Pending Labs Unresulted Labs (From admission, onward)     Start     Ordered   08/03/23 0500  Creatinine, serum  (enoxaparin (LOVENOX)    CrCl >/= 30 ml/min)  Weekly,   R     Comments: while on enoxaparin therapy    07/27/23 1440   07/28/23 0500  CBC with Differential  Tomorrow morning,   R        07/27/23 1440   07/27/23 1440  HIV Antibody (routine testing w rflx)  (HIV Antibody (Routine testing w reflex) panel)  Once,   R        07/27/23 1440   07/27/23 1440  CBC  Once,   R        07/27/23 1440            Vitals/Pain Today's Vitals   07/27/23 1024 07/27/23 1025 07/27/23 1031 07/27/23 1301  BP:   (!) 126/90 (!) 157/94  Pulse:   (!) 112 (!) 418  Resp:   19 19  Temp:   98.1 F (36.7 C) 98 F (36.7 C)  TempSrc:   Oral Oral  SpO2:   94% 92%  Weight:  97 kg    Height:  5\' 10"  (1.778 m)    PainSc: 0-No pain       Isolation Precautions No active isolations  Medications Medications  enoxaparin (LOVENOX) injection 40 mg (has no administration in time range)  0.9 %  sodium chloride infusion (has no administration in time range)  doxycycline (VIBRA-TABS) tablet 100 mg (has no administration in time range)  methylPREDNISolone sodium succinate (SOLU-MEDROL) 125 mg/2 mL injection 80 mg (has no administration in time range)    Followed by  predniSONE (DELTASONE) tablet 40 mg (has no administration in time range)  albuterol (PROVENTIL) (2.5 MG/3ML) 0.083% nebulizer solution 2.5 mg (has no administration in time range)  revefenacin (YUPELRI) nebulizer solution 175 mcg (has no administration in time range)  budesonide (PULMICORT) nebulizer solution 0.25 mg (has no administration in time range)  arformoterol (BROVANA) nebulizer solution 15 mcg (has no administration in time range)  HYDROcodone bit-homatropine (HYCODAN) 5-1.5 MG/5ML syrup 5 mL (has no  administration in time range)  methylPREDNISolone sodium succinate (SOLU-MEDROL) 125 mg/2 mL injection 125 mg (125 mg Intravenous Given 07/27/23 1057)  ipratropium-albuterol (DUONEB) 0.5-2.5 (3) MG/3ML nebulizer solution 3 mL (3 mLs Nebulization Given 07/27/23 1128)  ipratropium-albuterol (DUONEB) 0.5-2.5 (3) MG/3ML nebulizer solution 3 mL (3 mLs Nebulization Given 07/27/23 1056)  azithromycin (ZITHROMAX) tablet 500 mg (500 mg Oral Given 07/27/23 1058)  Mobility walks     Focused Assessments   R Recommendations: See Admitting Provider Note  Report given to:   Additional Notes:

## 2023-07-27 NOTE — H&P (Signed)
History and Physical    Frank Webb ZOX:[id number] DOB: 22-Feb-1956 DOA: 07/27/2023  PCP: Verlon Au, MD   Patient coming from: Home Chief Complaint  Patient presents with   Shortness of Breath      HPI: 67 year old male with history of COPD, HTN, HLD presented to the ED with complaint of shortness of breath since 9/9 and worsened today, has associated productive cough. He has some greenish sputum production.  Patient otherwise denies any nausea, vomiting, chest pain, fever, chills, headache, focal weakness, numbness tingling, speech difficulties  In the ED, mild tachycardia in 110 respiration 19 BP 126/90, was hypoxic 88% on room air on ambulation dropped as low as 83%, placed on 2l [place]. Labs BMP elevated creatinine 1.4 CBC pending BNP normal 30 influenza COVID screening negative.EKG showed sinus tachycardia. Patient was given IV Solu-Medrol 25 mg, DuoNeb azithromycin and admission requested. Last admission at: Was in December 2023 for COPD exacerbation and influenza and AKI   Assessment/Plan Principal Problem:   COPD with acute exacerbation (HCC)  Acute COPD exacerbation Acute hypoxic respiratory failure: Patient with respiratory failure shortness of breath in the setting of COPD exacerbation.  Chest x-ray no pneumonia. Continue IV Solu-Medrol, Afluria/Brovana/Pulmicort, DuoNeb as needed, Singulair, azithromycin, antitussive.  Mobilize slowly  AKI: Baseline creatinine around 1.1 currently elevated 1.4 at gentle IV fluids hold Lasix and Aldactone  Tobacco use disorder: He quit June end  Hypertension: Blood pressure stable. PTA on amlodipine, hydralazine, Lasix Coreg. Resume only Coreg, hydralazine for now, and slowly resume rest  HLD: Resume atorvastatin  Body mass index is 30.68 kg/m.   Severity of Illness: The appropriate patient status for this patient is OBSERVATION. Observation status is judged to be reasonable and necessary in order to provide the required  intensity of service to ensure the patient's safety. The patient's presenting symptoms, physical exam findings, and initial radiographic and laboratory data in the context of their medical condition is felt to place them at decreased risk for further clinical deterioration. Furthermore, it is anticipated that the patient will be medically stable for discharge from the hospital within 2 midnights of admission.    DVT prophylaxis: enoxaparin (LOVENOX) injection 40 mg Start: 07/27/23 1445 Code Status:   Code Status: Full Code  Family Communication: Admission, patients condition and plan of care including tests being ordered have been discussed with the patient  who indicate understanding and agree with the plan and Code Status.  Consults called:  none  Review of Systems: All systems were reviewed and were negative except as mentioned in HPI above. Negative for fever Negative for chest pain Negative for dysurea  Past Medical History:  Diagnosis Date   Cardiomyopathy, ischemic    a. EF 35% in 02/2010 prior to PCI;  b. echo 07/2010: Moderate LVH, EF 45-50%, mild posterior HK, severe inferior HK, grade 1 diastolic dysfunction, mild MR, mild LAE; c. 04/2016 Echo: EF 55-60%, Gr2 DD, mild MR.   Chronic combined systolic (congestive) and diastolic (congestive) heart failure (HCC)    a. EF prev 35% in 2011-->55-60% 04/2016 echo.   Coronary artery disease    a. 02/2010 inferior MI -> Cath: Serial proximal, mid and distal LAD 40%, apical LAD 60%, dCFX serial 70%, OM2 with chronic occlusion, small oOM3 80%, pRCA 40%, mRCA 80%, dRCA 80%, PDA serial 70%, EF 35%.  PCI: Overlapping BMS x 2 (2.75 x 32 mm Veriflex and 2.75 x 16 mm Veriflex) to RCA; c. 05/2016 MV: inf/lat infarct w/o ischemia. EF 44%.  Epistaxis, recurrent    "related to blood thinners; didn't last for long" (05/14/2016)   Fatigue    beta blocker d/c'd   Hyperlipidemia    Hypertension    Noncompliance    Syncope    a. negative EP study 02/2010     Past Surgical History:  Procedure Laterality Date   CARDIAC CATHETERIZATION  03/10/2010   This study demonstrates no inducible VT or inducible SVT in a patient with ischemic heart disease, mild LV dysfunction (normal echo, 35% by cath with prior stenting to the RCA)  --     CORONARY ANGIOPLASTY WITH STENT PLACEMENT  03/06/2010    drug-eluting stents in the RCA     reports that he quit smoking about 3 months ago. His smoking use included cigarettes. He has a 40 pack-year smoking history. He has never used smokeless tobacco. He reports current alcohol use of about 11.0 standard drinks of alcohol per week. He reports that he does not use drugs.  No Known Allergies  Family History  Problem Relation Age of Onset   Pneumonia Father 66   Cancer Mother 40   Diabetes Sister    Hypertension Sister    Hypertension Brother    Hypertension Brother    Diabetes Brother      Prior to Admission medications   Medication Sig Start Date End Date Taking? Authorizing Provider  amLODipine (NORVASC) 10 MG tablet Take 1 tablet (10 mg total) by mouth daily. 05/17/16   Creig Hines, NP  aspirin 81 MG tablet Take 1 tablet (81 mg total) by mouth daily. 05/17/16   Creig Hines, NP  atorvastatin (LIPITOR) 80 MG tablet Take 1 tablet (80 mg total) by mouth daily at 6 PM. 01/14/17   Jake Bathe, MD  benzonatate (TESSALON) 100 MG capsule Take 1 capsule (100 mg total) by mouth every 8 (eight) hours. 04/24/23   Charlynne Pander, MD  carvedilol (COREG) 12.5 MG tablet Take 1 tablet (12.5 mg total) by mouth 2 (two) times daily. Patient taking differently: Take 25 mg by mouth 2 (two) times daily. 01/14/17   Jake Bathe, MD  furosemide (LASIX) 40 MG tablet Take 0.5 tablets (20 mg total) by mouth 2 (two) times daily. Patient taking differently: Take 40 mg by mouth daily. 01/14/17   Jake Bathe, MD  guaiFENesin-dextromethorphan (ROBITUSSIN DM) 100-10 MG/5ML syrup Take 5 mLs by mouth every 4  (four) hours as needed for cough (chest congestion). 11/03/22   Kathlen Mody, MD  hydrALAZINE (APRESOLINE) 100 MG tablet TAKE ONE TABLET BY MOUTH THREE TIMES DAILY 06/24/17   Jake Bathe, MD  ipratropium (ATROVENT) 0.06 % nasal spray Place 2 sprays into the nose 3 (three) times daily. 05/29/22   [provider]  losartan (COZAAR) 100 MG tablet Take 1 tablet (100 mg total) by mouth daily. 05/17/16   Creig Hines, NP  montelukast (SINGULAIR) 10 MG tablet Take 1 tablet (10 mg total) by mouth at bedtime. 11/03/22   Kathlen Mody, MD  nicotine (NICODERM CQ - DOSED IN MG/24 HOURS) 14 mg/24hr patch Place 1 patch (14 mg total) onto the skin daily. 11/04/22   Kathlen Mody, MD  predniSONE (DELTASONE) 20 MG tablet Take 2 tablets (40 mg total) by mouth daily. 05/07/23   Achille Rich, PA-C    Physical Exam: Vitals:   07/27/23 1025 07/27/23 1031 07/27/23 1301  BP:  (!) 126/90 (!) 157/94  Pulse:  (!) 112 (!) 418  Resp:  19 19  Temp:  98.1 F (36.7 C) 98 F (36.7 C)  TempSrc:  Oral Oral  SpO2:  94% 92%  Weight: 97 kg    Height: 5\' 10"  (1.778 m)      General exam: AAOx3 , NAD, weak appearing. HEENT:Oral mucosa moist, Ear/Nose WNL grossly, dentition normal. Respiratory system: bilaterally diminished w/ wheezing on expiration, coughs when he takes deep breath, Cardiovascular system: S1 & S2 +, No JVD,. Gastrointestinal system: Abdomen soft, NT,ND, BS+ Nervous System:Alert, awake, moving extremities and grossly nonfocal Extremities: No edema, distal peripheral pulses palpable.  Skin: No rashes,no icterus. MSK: Normal muscle bulk,tone, power   Labs on Admission: I have personally reviewed following labs and imaging studies  CBC: No results for input(s): "WBC", "NEUTROABS", "HGB", "HCT", "MCV", "PLT" in the last 168 hours. Basic Metabolic Panel: Recent Labs  Lab 07/27/23 1042  NA 140  K 3.7  CL 103  CO2 26  GLUCOSE 113*  BUN 21  CREATININE 1.42*  CALCIUM 9.5    GFR: Estimated Creatinine Clearance: 59 mL/min (A) (by C-G formula based on SCr of 1.42 mg/dL (H)). Urine analysis:    Component Value Date/Time   COLORURINE YELLOW 10/30/2022 0906   APPEARANCEUR CLEAR 10/30/2022 0906   LABSPEC 1.016 10/30/2022 0906   PHURINE 5.0 10/30/2022 0906   GLUCOSEU NEGATIVE 10/30/2022 0906   HGBUR NEGATIVE 10/30/2022 0906   BILIRUBINUR NEGATIVE 10/30/2022 0906   KETONESUR NEGATIVE 10/30/2022 0906   PROTEINUR NEGATIVE 10/30/2022 0906   UROBILINOGEN 0.2 03/03/2010 0159   NITRITE NEGATIVE 10/30/2022 0906   LEUKOCYTESUR NEGATIVE 10/30/2022 0906    Radiological Exams on Admission: DG Chest 2 View  Result Date: 07/27/2023 CLINICAL DATA:  Shortness of breath. EXAM: CHEST - 2 VIEW COMPARISON:  05/07/2023 FINDINGS: The heart size and mediastinal contours are within normal limits. Both lungs are clear. The visualized skeletal structures are unremarkable. IMPRESSION: No active cardiopulmonary disease. Electronically Signed   By: Signa Kell M.D.   On: 07/27/2023 13:17      Lanae Boast MD [facility]  If 7PM-7AM, please contact night-coverage [email or web address]  07/27/2023, 2:41 PM

## 2023-07-27 NOTE — ED Triage Notes (Signed)
Pt BIBA from home c/o [facility] that began yesterday and worsened this morning. Pt reports productive cough. Hx of COPD, used his inhaler at home with no improvement. SpO2 91% RA. Two duonebs given enroute. Denies chest pain or dizziness.

## 2023-07-27 NOTE — Hospital Course (Addendum)
67 year old male with history of COPD, HTN, HLD presented to the ED with complaint of shortness of breath since 9/9 and worsened today, has associated productive cough. He has some greenish sputum production.  Patient otherwise denies any nausea, vomiting, chest pain, fever, chills, headache, focal weakness, numbness tingling, speech difficulties  In the ED, mild tachycardia in 110 respiration 19 BP 126/90, was hypoxic 88% on room air on ambulation dropped as low as 83%, placed on 2l [place]. Labs BMP elevated creatinine 1.4 CBC pending BNP normal 30 influenza COVID screening negative.EKG showed sinus tachycardia. Patient was given IV Solu-Medrol 25 mg, DuoNeb azithromycin and admission requested. Last admission at: Was in December 2023 for COPD exacerbation and influenza and AKI

## 2023-07-28 DIAGNOSIS — Z87891 Personal history of nicotine dependence: Secondary | ICD-10-CM | POA: Diagnosis not present

## 2023-07-28 DIAGNOSIS — E669 Obesity, unspecified: Secondary | ICD-10-CM | POA: Diagnosis present

## 2023-07-28 DIAGNOSIS — Z6830 Body mass index (BMI) 30.0-30.9, adult: Secondary | ICD-10-CM | POA: Diagnosis not present

## 2023-07-28 DIAGNOSIS — N179 Acute kidney failure, unspecified: Secondary | ICD-10-CM | POA: Diagnosis present

## 2023-07-28 DIAGNOSIS — E860 Dehydration: Secondary | ICD-10-CM | POA: Diagnosis present

## 2023-07-28 DIAGNOSIS — I110 Hypertensive heart disease with heart failure: Secondary | ICD-10-CM | POA: Diagnosis present

## 2023-07-28 DIAGNOSIS — J441 Chronic obstructive pulmonary disease with (acute) exacerbation: Secondary | ICD-10-CM | POA: Diagnosis present

## 2023-07-28 DIAGNOSIS — I10 Essential (primary) hypertension: Secondary | ICD-10-CM | POA: Diagnosis not present

## 2023-07-28 DIAGNOSIS — I2510 Atherosclerotic heart disease of native coronary artery without angina pectoris: Secondary | ICD-10-CM | POA: Diagnosis present

## 2023-07-28 DIAGNOSIS — Z955 Presence of coronary angioplasty implant and graft: Secondary | ICD-10-CM | POA: Diagnosis not present

## 2023-07-28 DIAGNOSIS — Z8249 Family history of ischemic heart disease and other diseases of the circulatory system: Secondary | ICD-10-CM | POA: Diagnosis not present

## 2023-07-28 DIAGNOSIS — Z7952 Long term (current) use of systemic steroids: Secondary | ICD-10-CM | POA: Diagnosis not present

## 2023-07-28 DIAGNOSIS — Z79899 Other long term (current) drug therapy: Secondary | ICD-10-CM | POA: Diagnosis not present

## 2023-07-28 DIAGNOSIS — I252 Old myocardial infarction: Secondary | ICD-10-CM | POA: Diagnosis not present

## 2023-07-28 DIAGNOSIS — I255 Ischemic cardiomyopathy: Secondary | ICD-10-CM | POA: Diagnosis present

## 2023-07-28 DIAGNOSIS — R0902 Hypoxemia: Secondary | ICD-10-CM | POA: Diagnosis present

## 2023-07-28 DIAGNOSIS — I5042 Chronic combined systolic (congestive) and diastolic (congestive) heart failure: Secondary | ICD-10-CM | POA: Diagnosis present

## 2023-07-28 DIAGNOSIS — Z7982 Long term (current) use of aspirin: Secondary | ICD-10-CM | POA: Diagnosis not present

## 2023-07-28 DIAGNOSIS — J9601 Acute respiratory failure with hypoxia: Secondary | ICD-10-CM | POA: Diagnosis present

## 2023-07-28 DIAGNOSIS — D72829 Elevated white blood cell count, unspecified: Secondary | ICD-10-CM | POA: Diagnosis not present

## 2023-07-28 DIAGNOSIS — Z1152 Encounter for screening for COVID-19: Secondary | ICD-10-CM | POA: Diagnosis not present

## 2023-07-28 DIAGNOSIS — E785 Hyperlipidemia, unspecified: Secondary | ICD-10-CM | POA: Diagnosis present

## 2023-07-28 DIAGNOSIS — Z833 Family history of diabetes mellitus: Secondary | ICD-10-CM | POA: Diagnosis not present

## 2023-07-28 DIAGNOSIS — T380X5A Adverse effect of glucocorticoids and synthetic analogues, initial encounter: Secondary | ICD-10-CM | POA: Diagnosis not present

## 2023-07-28 LAB — CBC WITH DIFFERENTIAL/PLATELET
Abs Immature Granulocytes: 0.07 10*3/uL (ref 0.00–0.07)
Basophils Absolute: 0 10*3/uL (ref 0.0–0.1)
Basophils Relative: 0 %
Eosinophils Absolute: 0 10*3/uL (ref 0.0–0.5)
Eosinophils Relative: 0 %
HCT: 40.6 % (ref 39.0–52.0)
Hemoglobin: 12.7 g/dL — ABNORMAL LOW (ref 13.0–17.0)
Immature Granulocytes: 1 %
Lymphocytes Relative: 6 %
Lymphs Abs: 0.8 10*3/uL (ref 0.7–4.0)
MCH: 24.3 pg — ABNORMAL LOW (ref 26.0–34.0)
MCHC: 31.3 g/dL (ref 30.0–36.0)
MCV: 77.8 fL — ABNORMAL LOW (ref 80.0–100.0)
Monocytes Absolute: 0.2 10*3/uL (ref 0.1–1.0)
Monocytes Relative: 2 %
Neutro Abs: 13.7 10*3/uL — ABNORMAL HIGH (ref 1.7–7.7)
Neutrophils Relative %: 91 %
Platelets: 282 10*3/uL (ref 150–400)
RBC: 5.22 MIL/uL (ref 4.22–5.81)
RDW: 16.4 % — ABNORMAL HIGH (ref 11.5–15.5)
WBC: 14.8 10*3/uL — ABNORMAL HIGH (ref 4.0–10.5)
nRBC: 0 % (ref 0.0–0.2)

## 2023-07-28 LAB — BASIC METABOLIC PANEL
Anion gap: 9 (ref 5–15)
BUN: 29 mg/dL — ABNORMAL HIGH (ref 8–23)
CO2: 23 mmol/L (ref 22–32)
Calcium: 9 mg/dL (ref 8.9–10.3)
Chloride: 103 mmol/L (ref 98–111)
Creatinine, Ser: 1.42 mg/dL — ABNORMAL HIGH (ref 0.61–1.24)
GFR, Estimated: 54 mL/min — ABNORMAL LOW (ref >60)
Glucose, Bld: 124 mg/dL — ABNORMAL HIGH (ref 70–99)
Potassium: 4.3 mmol/L (ref 3.5–5.1)
Sodium: 135 mmol/L (ref 135–145)

## 2023-07-28 MED ORDER — GUAIFENESIN-DM 100-10 MG/5ML PO SYRP
5.0000 mL | ORAL_SOLUTION | ORAL | Status: DC | PRN
  Administered 2023-07-28 – 2023-07-30 (×4): 5 mL via ORAL
  Filled 2023-07-28 (×4): qty 5

## 2023-07-28 MED ORDER — ACETAMINOPHEN 325 MG PO TABS
650.0000 mg | ORAL_TABLET | Freq: Four times a day (QID) | ORAL | Status: DC | PRN
  Administered 2023-07-28 – 2023-07-30 (×4): 650 mg via ORAL
  Filled 2023-07-28 (×4): qty 2

## 2023-07-28 MED ORDER — METHYLPREDNISOLONE SODIUM SUCC 125 MG IJ SOLR
60.0000 mg | Freq: Two times a day (BID) | INTRAMUSCULAR | Status: DC
  Administered 2023-07-28 – 2023-07-30 (×4): 60 mg via INTRAVENOUS
  Filled 2023-07-28 (×4): qty 2

## 2023-07-28 NOTE — Plan of Care (Signed)
  Problem: Education: Goal: Knowledge of General Education information will improve Description: Including pain rating scale, medication(s)/side effects and non-pharmacologic comfort measures Outcome: Progressing   Problem: Health Behavior/Discharge Planning: Goal: Ability to manage health-related needs will improve Outcome: Progressing   Problem: Clinical Measurements: Goal: Will remain free from infection Outcome: Progressing Goal: Diagnostic test results will improve Outcome: Progressing Goal: Cardiovascular complication will be avoided Outcome: Progressing   Problem: Activity: Goal: Risk for activity intolerance will decrease Outcome: Progressing   Problem: Elimination: Goal: Will not experience complications related to bowel motility Outcome: Progressing Goal: Will not experience complications related to urinary retention Outcome: Progressing   Problem: Skin Integrity: Goal: Risk for impaired skin integrity will decrease Outcome: Progressing

## 2023-07-28 NOTE — Evaluation (Signed)
Physical Therapy Evaluation Patient Details Name: Frank Webb MRN: [id number] DOB: 11-Sep-1956 Today's Date: 07/28/2023  History of Present Illness  67 yo male presents to therapy following hospital admission on 07/27/2023 secondary to SOB, productive cough with green sputum. In ED pt was found to be hypoxic, tachycardic with negative respiratory panel. Pt dx with acute COPD exacerbation. Pt has PMH including but not limited to: COPD, CHF, CAD, AKI, tobacco abuse with recent cessation, HTN, and HLD.  Clinical Impression     Pt admitted with above diagnosis.  Pt currently with functional limitations due to the deficits listed below (see PT Problem List). Pt in bed when PT arrived. Pt agreeable to therapy eval. Pt is IND for bed mobility and transfer tasks, no AD. PT provided pt with Teaticket tube/cord extender to be able to navigate in personal room and use bathroom. Pt amb in hallway with use of IV pole and S demonstrating good reciprocal pattern, LE passing in stance phase and B foot clearance with slightly decreased cadence. Pt indicated SOB with gait of 250 feet and O2 saturation once seated EOB 90% on 3 L/min. Pt left seated EOB per pt preference and all needs in place.  Pt will benefit from acute skilled PT to increase their independence and safety with mobility to allow discharge.         If plan is discharge home, recommend the following: A little help with walking and/or transfers;Assistance with cooking/housework;Assist for transportation   Can travel by private vehicle        Equipment Recommendations None recommended by PT  Recommendations for Other Services       Functional Status Assessment Patient has had a recent decline in their functional status and demonstrates the ability to make significant improvements in function in a reasonable and predictable amount of time.     Precautions / Restrictions Precautions Precautions:  (monitor and wean O2) Restrictions Weight Bearing  Restrictions: No      Mobility  Bed Mobility Overal bed mobility: Independent Bed Mobility: Supine to Sit     Supine to sit: Independent          Transfers Overall transfer level: Independent Equipment used: None Transfers: Sit to/from Stand Sit to Stand: Independent                Ambulation/Gait Ambulation/Gait assistance: Supervision Gait Distance (Feet): 250 Feet Assistive device: IV Pole Gait Pattern/deviations: Step-through pattern Gait velocity: slightly decreased     General Gait Details: good reciprocal pattern, LEs passing in stance phase and clearing floor, pt indicated slight SOB and O2 saturation 90% on 2 L/min  Stairs            Wheelchair Mobility     Tilt Bed    Modified Rankin (Stroke Patients Only)       Balance Overall balance assessment: No apparent balance deficits (not formally assessed)                                           Pertinent Vitals/Pain Pain Assessment Pain Assessment: No/denies pain    Home Living Family/patient expects to be discharged to:: Private residence Living Arrangements: Alone   Type of Home: House Home Access: Level entry       Home Layout: One level Home Equipment: None      Prior Function Prior Level of Function : Independent/Modified Independent;Driving  Mobility Comments: He was independent with ambulation. ADLs Comments: He was independent with ADLs, cooking, cleaning, and driving.     Extremity/Trunk Assessment        Lower Extremity Assessment Lower Extremity Assessment: Overall WFL for tasks assessed    Cervical / Trunk Assessment Cervical / Trunk Assessment:  (wfl)  Communication   Communication Communication: No apparent difficulties  Cognition Arousal: Alert Behavior During Therapy: WFL for tasks assessed/performed Overall Cognitive Status: Within Functional Limits for tasks assessed                                  General Comments: Oriented x4, able to follow commands without difficulty        General Comments      Exercises     Assessment/Plan    PT Assessment Patient needs continued PT services  PT Problem List Decreased activity tolerance;Cardiopulmonary status limiting activity       PT Treatment Interventions Gait training;Functional mobility training;Therapeutic activities;Patient/family education;Therapeutic exercise;Balance training    PT Goals (Current goals can be found in the Care Plan section)  Acute Rehab PT Goals Patient Stated Goal: to be able to go home without supplemental O2 PT Goal Formulation: With patient Time For Goal Achievement: 08/11/23 Potential to Achieve Goals: Good    Frequency Min 1X/week     Co-evaluation               AM-PAC PT "6 Clicks" Mobility  Outcome Measure Help needed turning from your back to your side while in a flat bed without using bedrails?: None Help needed moving from lying on your back to sitting on the side of a flat bed without using bedrails?: None Help needed moving to and from a bed to a chair (including a wheelchair)?: None Help needed standing up from a chair using your arms (e.g., wheelchair or bedside chair)?: None Help needed to walk in hospital room?: A Little Help needed climbing 3-5 steps with a railing? : A Lot 6 Click Score: 21    End of Session Equipment Utilized During Treatment: Gait belt;Oxygen Activity Tolerance: Patient tolerated treatment well (mild SOB with exertion) Patient left: in bed;with call bell/phone within reach Nurse Communication: Mobility status PT Visit Diagnosis: Difficulty in walking, not elsewhere classified (R26.2)    Time: 2956-2130 PT Time Calculation (min) (ACUTE ONLY): 16 min   Charges:   PT Evaluation $PT Eval Low Complexity: 1 Low   PT General Charges $$ ACUTE PT VISIT: 1 Visit         Johnny Bridge, PT Acute Rehab   Jacqualyn Posey 07/28/2023, 2:40 PM

## 2023-07-28 NOTE — Evaluation (Signed)
Occupational Therapy Evaluation Patient Details Name: Frank Webb MRN: [id number] DOB: [birth year] Today's Date: 07/28/2023   History of Present Illness Frank Webb is a 77 yr odl male brought to the hospital with shortness of breath and cough. He was found to have COPD exacerbation. PMH: COPD, HTN, HLD   Clinical Impression   The pt performed all assessed tasks without the need for assistance, including lower body dressing, sit to stand, ambulation in the hall without an assistive device, and toileting at bathroom level. He was noted to be on 2L O2 via nasal cannula. His O2 saturation was 96% at rest on 2L O2 & 91% on 2L O2 with activity.  He appears to be very near to his baseline level of functioning for self-care management & he does not require further OT services. OT will sign off.       If plan is discharge home, recommend the following: Assist for transportation    Functional Status Assessment  Patient has not had a recent decline in their functional status  Equipment Recommendations  None recommended by OT    Recommendations for Other Services       Precautions / Restrictions Restrictions Weight Bearing Restrictions: No      Mobility Bed Mobility Overal bed mobility: Independent Bed Mobility: Supine to Sit     Supine to sit: Independent          Transfers Overall transfer level: Independent Equipment used: None Transfers: Sit to/from Stand Sit to Stand: Independent                  Balance Overall balance assessment: No apparent balance deficits (not formally assessed)         ADL either performed or assessed with clinical judgement   ADL Overall ADL's : Independent;At baseline         Vision   Additional Comments: He correctly read the time depicted on the wall clock.            Pertinent Vitals/Pain Pain Assessment Pain Assessment: No/denies pain     Extremity/Trunk Assessment Upper Extremity Assessment Upper Extremity  Assessment: Overall WFL for tasks assessed;Right hand dominant   Lower Extremity Assessment Lower Extremity Assessment: Overall WFL for tasks assessed       Communication Communication Communication: No apparent difficulties   Cognition Arousal: Alert Behavior During Therapy: WFL for tasks assessed/performed Overall Cognitive Status: Within Functional Limits for tasks assessed    General Comments: Oriented x4, able to follow commands without difficulty                 Home Living Family/patient expects to be discharged to:: Private residence Living Arrangements: Alone   Type of Home: House Home Access: Level entry     Home Layout: One level     Bathroom Shower/Tub: Tub/shower unit         Home Equipment: None          Prior Functioning/Environment Prior Level of Function : Independent/Modified Independent;Driving             Mobility Comments: He was independent with ambulation. ADLs Comments: He was independent with ADLs, cooking, cleaning, and driving.        OT Problem List:  (N/A)      OT Treatment/Interventions:   N/A      OT Frequency:  N/A       AM-PAC OT "6 Clicks" Daily Activity     Outcome Measure Help from another person eating meals?: None  Help from another person taking care of personal grooming?: None Help from another person toileting, which includes using toliet, bedpan, or urinal?: None Help from another person bathing (including washing, rinsing, drying)?: None Help from another person to put on and taking off regular upper body clothing?: None Help from another person to put on and taking off regular lower body clothing?: None 6 Click Score: 24   End of Session Equipment Utilized During Treatment: Oxygen Nurse Communication: Other (comment)  Activity Tolerance: Patient tolerated treatment well Patient left: in chair;with call bell/phone within reach  OT Visit Diagnosis: Muscle weakness (generalized) (M62.81)                 Time: 5284-1324 OT Time Calculation (min): 21 min Charges:  OT General Charges $OT Visit: 1 Visit OT Evaluation $OT Eval Low Complexity: 1 Low   Ishi Danser L Coreena Rubalcava, OTR/L 07/28/2023, 10:46 AM

## 2023-07-28 NOTE — Progress Notes (Signed)
[facility]                                                                               Frank Webb, is a 67 y.o. male, DOB - 04/05/1956, ZOX:[id number] Admit date - 07/27/2023    Outpatient Primary MD for the patient is Frank Au, MD  LOS - 0  days    Brief summary   67 year old male with history of COPD, HTN, HLD presented to the ED with complaint of shortness of breath since 9/9 and worsened today, has associated productive cough. He has some greenish sputum production.  Patient otherwise denies any nausea, vomiting, chest pain, fever, chills, headache, focal weakness, numbness tingling, speech difficulties  In the ED, mild tachycardia in 110 respiration 19 BP 126/90, was hypoxic 88% on room air on ambulation dropped as low as 83%, placed on 2l [place]. Labs BMP elevated creatinine 1.4 CBC pending BNP normal 30 influenza COVID screening negative.EKG showed sinus tachycardia. Patient was given IV Solu-Medrol  DuoNebs,  azithromycin and admission requested. Last admission at: Was in December 2023 for COPD exacerbation and influenza and AKI   Assessment & Plan    Assessment and Plan:  Acute respiratory failure with hypoxia secondary to acute COPD exacerbation   Patient is on IV Solu-Medrol 60 mg every 12 continue the same for the next 24 hours.  On exam patient still has bilateral expiratory wheezing both anteriorly and posteriorly. Continue with Brovana and Pulmicort, doxycycline and antitussives. Ambulate as tolerated. Respiratory panel is negative, chest x-ray is negative for pneumonia.  Acute kidney injury probably secondary to poor oral intake/dehydration Creatinine at 1.4 on admission.  Lasix and Aldactone were held. Repeat BMP today to check renal parameters.  Tobacco use disorder Patient reports cessation 4 months ago   Essential hypertension Pressure parameters are  slightly elevated, slowly restart home meds.    Leukocytosis probably  secondary to steroids, continue to monitor    Estimated body mass index is 30.68 kg/m as calculated from the following:   Height as of this encounter: 5\' 10"  (1.778 m).   Weight as of this encounter: 97 kg.  Code Status: full code.  DVT Prophylaxis:  enoxaparin (LOVENOX) injection 40 mg Start: 07/27/23 1800   Level of Care: Level of care: Telemetry Family Communication:none at bedside.   Disposition Plan:     Remains inpatient appropriate:  IV steroids.   Procedures:  None.   Consultants:   None.   Antimicrobials:   Anti-infectives (From admission, onward)    Start     Dose/Rate Route Frequency Ordered Stop   07/27/23 2200  doxycycline (VIBRA-TABS) tablet 100 mg        100 mg Oral Every 12 hours 07/27/23 1440 08/01/23 2159   07/27/23 1100  azithromycin (ZITHROMAX) tablet 500 mg        500 mg Oral  Once 07/27/23 1046 07/27/23 1058        Medications  Scheduled Meds:  arformoterol  15 mcg Nebulization BID   budesonide (PULMICORT) nebulizer solution  0.25 mg Nebulization BID   doxycycline  100 mg Oral Q12H   enoxaparin (LOVENOX) injection  40 mg Subcutaneous Q24H  methylPREDNISolone (SOLU-MEDROL) injection  60 mg Intravenous Q12H   revefenacin  175 mcg Nebulization Daily   Continuous Infusions:  sodium chloride 75 mL/hr at 07/28/23 0743   PRN Meds:.albuterol, guaiFENesin-dextromethorphan, HYDROcodone bit-homatropine    Subjective:   [name] was seen and examined today.  Reports still feeling very tight and sob.   Objective:   Vitals:   07/27/23 1952 07/27/23 2156 07/28/23 0535 07/28/23 0746  BP:  (!) 145/88 (!) 144/94   Pulse:  80 72   Resp:  18 18   Temp:  98.3 F (36.8 C) 97.8 F (36.6 C)   TempSrc:  Oral Oral   SpO2: 93% 97% 98% 99%  Weight:      Height:        Intake/Output Summary (Last 24 hours) at 07/28/2023 1237 Last data filed at 07/28/2023 0930 Gross per 24 hour  Intake 1319.11 ml  Output 250 ml  Net 1069.11 ml   Filed  Weights   07/27/23 1025  Weight: 97 kg     Exam General exam: Appears calm and comfortable  Respiratory system: bilateral expiratory wheezing . On 2 LIT OF [place] oxygen.  Cardiovascular system: S1 & S2 heard, RRR. No JVD, murmurs, rubs, gallops or clicks. No pedal edema. Gastrointestinal system: Abdomen is nondistended, soft and nontender. No organomegaly or masses felt. Normal bowel sounds heard. Central nervous system: Alert and oriented. No focal neurological deficits. Extremities: Symmetric 5 x 5 power. Skin: No rashes, lesions or ulcers Psychiatry: Judgement and insight appear normal. Mood & affect appropriate.    Data Reviewed:  I have personally reviewed following labs and imaging studies   CBC Lab Results  Component Value Date   WBC 14.8 (H) 07/28/2023   RBC 5.22 07/28/2023   HGB 12.7 (L) 07/28/2023   HCT 40.6 07/28/2023   MCV 77.8 (L) 07/28/2023   MCH 24.3 (L) 07/28/2023   PLT 282 07/28/2023   MCHC 31.3 07/28/2023   RDW 16.4 (H) 07/28/2023   LYMPHSABS 0.8 07/28/2023   MONOABS 0.2 07/28/2023   EOSABS 0.0 07/28/2023   BASOSABS 0.0 07/28/2023     Last metabolic panel Lab Results  Component Value Date   NA 140 07/27/2023   K 3.7 07/27/2023   CL 103 07/27/2023   CO2 26 07/27/2023   BUN 21 07/27/2023   CREATININE 1.42 (H) 07/27/2023   GLUCOSE 113 (H) 07/27/2023   GFRNONAA 54 (L) 07/27/2023   GFRAA >60 05/15/2016   CALCIUM 9.5 07/27/2023   PHOS 3.4 10/31/2022   PROT 7.2 05/07/2023   ALBUMIN 3.5 05/07/2023   BILITOT 1.3 (H) 05/07/2023   ALKPHOS 71 05/07/2023   AST 19 05/07/2023   ALT 10 05/07/2023   ANIONGAP 11 07/27/2023    CBG (last 3)  No results for input(s): "GLUCAP" in the last 72 hours.    Coagulation Profile: No results for input(s): "INR", "PROTIME" in the last 168 hours.   Radiology Studies: DG Chest 2 View  Result Date: 07/27/2023 CLINICAL DATA:  Shortness of breath. EXAM: CHEST - 2 VIEW COMPARISON:  05/07/2023 FINDINGS: The heart size  and mediastinal contours are within normal limits. Both lungs are clear. The visualized skeletal structures are unremarkable. IMPRESSION: No active cardiopulmonary disease. Electronically Signed   By: Signa Kell M.D.   On: 07/27/2023 13:17       Kathlen Mody M.D. [facility] 07/28/2023, 12:37 PM  Available via Epic secure chat 7am-7pm After 7 pm, please refer to night coverage provider listed on amion.

## 2023-07-29 DIAGNOSIS — J441 Chronic obstructive pulmonary disease with (acute) exacerbation: Secondary | ICD-10-CM | POA: Diagnosis not present

## 2023-07-29 LAB — CBC WITH DIFFERENTIAL/PLATELET
Abs Immature Granulocytes: 0.19 10*3/uL — ABNORMAL HIGH (ref 0.00–0.07)
Basophils Absolute: 0 10*3/uL (ref 0.0–0.1)
Basophils Relative: 0 %
Eosinophils Absolute: 0 10*3/uL (ref 0.0–0.5)
Eosinophils Relative: 0 %
HCT: 41.7 % (ref 39.0–52.0)
Hemoglobin: 12.8 g/dL — ABNORMAL LOW (ref 13.0–17.0)
Immature Granulocytes: 1 %
Lymphocytes Relative: 3 %
Lymphs Abs: 0.7 10*3/uL (ref 0.7–4.0)
MCH: 24.1 pg — ABNORMAL LOW (ref 26.0–34.0)
MCHC: 30.7 g/dL (ref 30.0–36.0)
MCV: 78.4 fL — ABNORMAL LOW (ref 80.0–100.0)
Monocytes Absolute: 0.5 10*3/uL (ref 0.1–1.0)
Monocytes Relative: 2 %
Neutro Abs: 19.1 10*3/uL — ABNORMAL HIGH (ref 1.7–7.7)
Neutrophils Relative %: 94 %
Platelets: 272 10*3/uL (ref 150–400)
RBC: 5.32 MIL/uL (ref 4.22–5.81)
RDW: 16.4 % — ABNORMAL HIGH (ref 11.5–15.5)
WBC: 20.5 10*3/uL — ABNORMAL HIGH (ref 4.0–10.5)
nRBC: 0 % (ref 0.0–0.2)

## 2023-07-29 LAB — BASIC METABOLIC PANEL
Anion gap: 10 (ref 5–15)
BUN: 36 mg/dL — ABNORMAL HIGH (ref 8–23)
CO2: 22 mmol/L (ref 22–32)
Calcium: 9 mg/dL (ref 8.9–10.3)
Chloride: 104 mmol/L (ref 98–111)
Creatinine, Ser: 1.35 mg/dL — ABNORMAL HIGH (ref 0.61–1.24)
GFR, Estimated: 58 mL/min — ABNORMAL LOW (ref >60)
Glucose, Bld: 142 mg/dL — ABNORMAL HIGH (ref 70–99)
Potassium: 4 mmol/L (ref 3.5–5.1)
Sodium: 136 mmol/L (ref 135–145)

## 2023-07-29 NOTE — Progress Notes (Signed)
SATURATION QUALIFICATIONS: (This note is used to comply with regulatory documentation for home oxygen)  Patient Saturations on Room Air at Rest = 94%  Patient Saturations on Room Air while Ambulating = 87%  Patient Saturations on -- Liters of oxygen while Ambulating = --% TBD  Please briefly explain why patient needs home oxygen: to maintain appropriate SpO2 levels.   Ralene Bathe Kistler PT 07/29/2023  Acute Rehabilitation Services  Office [phone]

## 2023-07-29 NOTE — Plan of Care (Signed)

## 2023-07-29 NOTE — Progress Notes (Signed)
[facility] PROGRESS NOTE  Frank Webb (DOB: 04-Dec-1955) JYN:[id number] PCP: Verlon Au, MD ([facility])  Brief Narrative: Frank Webb is a 67 y.o. male with a history of COPD, admission for influenza Dec 2023, HTN, HLD who presented to the ED on 07/27/2023 with cough, increased sputum production and shortness of breath. He was given steroids, nebulized therapies, but continued to be hypoxemic prompting admission for AECOPD.  Subjective: Feeling better though SOB at rest which is not his baseline. Off oxygen when at rest, but needs it when walking.   Objective: BP 139/76 (BP Location: Right Arm)   Pulse 64   Temp 97.7 F (36.5 C) (Oral)   Resp 17   Ht 5\' 10"  (1.778 m)   Wt 97 kg   SpO2 99%   BMI 30.68 kg/m   Gen: No distress Pulm: Prolonged expiration with end expiratory wheezes, normal rate, normal effort, no crackles  CV: RRR, no MRG or pitting edema GI: Soft, NT, ND, +BS  Neuro: Alert and oriented. No new focal deficits. Ext: Warm, no deformities. Skin: No rashes, lesions or ulcers on visualized skin   Assessment & Plan: Acute hypoxic respiratory failure due to AECOPD: Continues to be hypoxemic with clinical bronchospasm, albeit improving. Suspect viral URI precipitant, covid/flu/RSV PCR negative. No infiltrate on CXR.  - Continue IV steroids BID - Continue scheduled and prn bronchodilators - 5 days doxycycline  - Continue antitussives as ordered. - Incentive spirometry - History of tobacco use (quite June 2024)  AKI:  - Hold further IVF, likely restart lasix/losartan in AM if renal function improved.   HTN:   - Normotensive currently so will hold home coreg (consider cardioselective BB if continues to have bronchospasm), norvasc, hydralazine, lasix, losartan  HLD:  - Continue atorvastatin   Obesity: Body mass index is 30.68 kg/m.    Tyrone Nine, MD [facility] [email or web address] 07/29/2023, 2:24 PM

## 2023-07-29 NOTE — Progress Notes (Signed)
Physical Therapy Treatment Patient Details Name: Frank Webb MRN: [id number] DOB: 1956/01/25 Today's Date: 07/29/2023   History of Present Illness 67 yo male presents to therapy following hospital admission on 07/27/2023 secondary to SOB, productive cough with green sputum. In ED pt was found to be hypoxic, tachycardic with negative respiratory panel. Pt dx with acute COPD exacerbation. Pt has PMH including but not limited to: COPD, CHF, CAD, AKI, tobacco abuse with recent cessation, HTN, and HLD.    PT Comments  Pt ambulated 325' without an assistive device, no loss of balance, SpO2 87% on RA walking, HR 80, 2/4 dyspnea. SpO2 94% on room air at rest. Pt is mobilizing well independently, but does report he still has some shortness of breath with activity. No further PT indicated as pt is independent with mobility, will have mobility specialist follow pt.     If plan is discharge home, recommend the following:     Can travel by private vehicle        Equipment Recommendations  None recommended by PT    Recommendations for Other Services       Precautions / Restrictions Precautions Precautions: Other (comment) Precaution Comments: monitor O2 Restrictions Weight Bearing Restrictions: No     Mobility  Bed Mobility Overal bed mobility: Independent Bed Mobility: Supine to Sit     Supine to sit: Independent          Transfers Overall transfer level: Independent Equipment used: None Transfers: Sit to/from Stand Sit to Stand: Independent                Ambulation/Gait Ambulation/Gait assistance: Independent Gait Distance (Feet): 325 Feet Assistive device: None Gait Pattern/deviations: WFL(Within Functional Limits) Gait velocity: WFL     General Gait Details: steady, no loss of balance, SpO2 87% on room air walking (HR 80, 2/4 dyspnea), 94% room air at rest   Stairs             Wheelchair Mobility     Tilt Bed    Modified Rankin (Stroke Patients  Only)       Balance Overall balance assessment: No apparent balance deficits (not formally assessed)                                          Cognition Arousal: Alert Behavior During Therapy: WFL for tasks assessed/performed Overall Cognitive Status: Within Functional Limits for tasks assessed                                 General Comments: Oriented x4, able to follow commands without difficulty        Exercises      General Comments        Pertinent Vitals/Pain Pain Assessment Pain Assessment: No/denies pain    Home Living                          Prior Function            PT Goals (current goals can now be found in the care plan section) Acute Rehab PT Goals 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 PT Goal Formulation: With patient Time For Goal Achievement: 08/11/23 Potential to Achieve Goals: Good Progress towards PT goals: Goals met/education completed, patient discharged from PT  Frequency    Min 1X/week      PT Plan      Co-evaluation              AM-PAC PT "6 Clicks" Mobility   Outcome Measure  Help needed turning from your back to your side while in a flat bed without using bedrails?: None Help needed moving from lying on your back to sitting on the side of a flat bed without using bedrails?: None Help needed moving to and from a bed to a chair (including a wheelchair)?: None Help needed standing up from a chair using your arms (e.g., wheelchair or bedside chair)?: None Help needed to walk in hospital room?: None Help needed climbing 3-5 steps with a railing? : None 6 Click Score: 24    End of Session   Activity Tolerance: Patient tolerated treatment well Patient left: in bed;with call bell/phone within reach Nurse Communication: Mobility status PT Visit Diagnosis: Difficulty in walking, not elsewhere classified (R26.2)     Time: 6237-6283 PT Time Calculation  (min) (ACUTE ONLY): 12 min  Charges:    $Gait Training: 8-22 mins PT General Charges $$ ACUTE PT VISIT: 1 Visit                     Tamala Ser PT 07/29/2023  Acute Rehabilitation Services  Office [phone]

## 2023-07-30 DIAGNOSIS — J441 Chronic obstructive pulmonary disease with (acute) exacerbation: Secondary | ICD-10-CM | POA: Diagnosis not present

## 2023-07-30 MED ORDER — DOXYCYCLINE MONOHYDRATE 100 MG PO TABS: 100.0000 mg | ORAL_TABLET | Freq: Two times a day (BID) | ORAL | 0 refills | Status: AC

## 2023-07-30 MED ORDER — PREDNISONE 20 MG PO TABS: 40.0000 mg | ORAL_TABLET | Freq: Every day | ORAL | 0 refills | Status: AC

## 2023-07-30 NOTE — Progress Notes (Signed)
Mobility Specialist - Progress Note   07/30/23 1017  Mobility  Activity Ambulated independently in hallway  Level of Assistance Independent  Assistive Device None  Distance Ambulated (ft) 500 ft  Activity Response Tolerated well  Mobility Referral Yes  $Mobility charge 1 Mobility  Mobility Specialist Start Time (ACUTE ONLY) 1012  Mobility Specialist Stop Time (ACUTE ONLY) 1017  Mobility Specialist Time Calculation (min) (ACUTE ONLY) 5 min   Pt received in bed and agreeable to mobility. Pt had some wheezing throughout session, but still eager to ambulate. No complaints during session. Pt to bed after session with all needs met.    [facility]

## 2023-07-30 NOTE — Discharge Summary (Signed)
Physician Discharge Summary   Patient: Frank Webb MRN: [id number] DOB: November 22, 1955  Admit date:     07/27/2023  Discharge date: 07/30/23  Discharge Physician: Tyrone Nine   PCP: Verlon Au, MD   Recommendations at discharge:  Follow up with PCP in 1-2 weeks.  Discharge Diagnoses: Principal Problem:   COPD with acute exacerbation [facility])  Hospital Course: Frank Webb is a 67 y.o. male with a history of COPD, admission for influenza Dec 2023, HTN, HLD who presented to the ED on 07/27/2023 with cough, increased sputum production and shortness of breath. He was given steroids, nebulized therapies, but continued to be hypoxemic prompting admission for AECOPD.   Assessment and Plan: Acute hypoxic respiratory failure due to AECOPD: Suspect viral URI precipitant, symptoms continue improving. No longer short of breath walking the halls, feels he's ready to discharge. No hypoxemia. Covid/flu/RSV PCR negative. No infiltrate on CXR.  - Continue scheduled and prn bronchodilators - 5 days doxycycline, complete 5 days with prednisone at discharge.  - Incentive spirometry - History of tobacco use (quite June 2024)   AKI:  - Hold further IVF, likely restart lasix/losartan in AM if renal function improved.    HTN:   - Continue home Tx's which includes coreg (consider cardioselective BB if continues to have bronchospasm)    HLD:  - Continue atorvastatin   Obesity: Body mass index is 30.68 kg/m.   Consultants: None Procedures performed: None  Disposition: Home Diet recommendation: Heart healthy DISCHARGE MEDICATION: Allergies as of 07/30/2023   No Known Allergies      Medication List     STOP taking these medications    nicotine 14 mg/24hr patch Commonly known as: NICODERM CQ - dosed in mg/24 hours       TAKE these medications    albuterol 108 (90 Base) MCG/ACT inhaler Commonly known as: VENTOLIN HFA Inhale 2 puffs into the lungs every 6 (six) hours as needed for  wheezing or shortness of breath.   amLODipine 5 MG tablet Commonly known as: NORVASC Take 5 mg by mouth daily. What changed: Another medication with the same name was removed. Continue taking this medication, and follow the directions you see here.   aspirin 81 MG tablet Take 1 tablet (81 mg total) by mouth daily.   atorvastatin 80 MG tablet Commonly known as: LIPITOR Take 1 tablet (80 mg total) by mouth daily at 6 PM.   benzonatate 100 MG capsule Commonly known as: TESSALON Take 1 capsule (100 mg total) by mouth every 8 (eight) hours.   carvedilol 25 MG tablet Commonly known as: COREG Take 25 mg by mouth 2 (two) times daily with a meal. What changed: Another medication with the same name was removed. Continue taking this medication, and follow the directions you see here.   doxycycline 100 MG tablet Commonly known as: ADOXA Take 1 tablet (100 mg total) by mouth 2 (two) times daily for 2 days.   furosemide 40 MG tablet Commonly known as: Lasix Take 0.5 tablets (20 mg total) by mouth 2 (two) times daily. What changed: how much to take   guaiFENesin-dextromethorphan 100-10 MG/5ML syrup Commonly known as: ROBITUSSIN DM Take 5 mLs by mouth every 4 (four) hours as needed for cough (chest congestion).   hydrALAZINE 100 MG tablet Commonly known as: APRESOLINE TAKE ONE TABLET BY MOUTH THREE TIMES DAILY What changed: when to take this   ipratropium 0.06 % nasal spray Commonly known as: ATROVENT Place 2 sprays into the nose 3 (  three) times daily as needed for rhinitis.   ipratropium-albuterol 0.5-2.5 (3) MG/3ML Soln Commonly known as: DUONEB Take 3 mLs by nebulization in the morning.   losartan 100 MG tablet Commonly known as: COZAAR Take 1 tablet (100 mg total) by mouth daily.   montelukast 10 MG tablet Commonly known as: SINGULAIR Take 1 tablet (10 mg total) by mouth at bedtime.   predniSONE 20 MG tablet Commonly known as: DELTASONE Take 2 tablets (40 mg total) by  mouth daily with breakfast for 2 days. What changed: when to take this   Systane Ultra PF 0.4-0.3 % Soln Generic drug: Polyethyl Glyc-Propyl Glyc PF Place 1 drop into both eyes 2 (two) times daily.   Trelegy Ellipta 200-62.5-25 MCG/ACT Aepb Generic drug: Fluticasone-Umeclidin-Vilant Inhale 1 puff into the lungs daily.        Follow-up Information     Verlon Au, MD Follow up.   Specialty: Family Medicine Contact information: [address] [place] [address] [phone]                Discharge Exam: Filed Weights   07/27/23 1025  Weight: 97 kg  BP (!) 151/83 (BP Location: Right Arm)   Pulse 64   Temp 98 F (36.7 C) (Oral)   Resp 18   Ht 5\' 10"  (1.778 m)   Wt 97 kg   SpO2 95%   BMI 30.68 kg/m   67yo WDWN M in no distress seen ambulating hallways with steady gait, no dyspnea.  Lungs clear, good air movement, nonlabored respirations  Condition at discharge: stable  The results of significant diagnostics from this hospitalization (including imaging, microbiology, ancillary and laboratory) are listed below for reference.   Imaging Studies: DG Chest 2 View  Result Date: 07/27/2023 CLINICAL DATA:  Shortness of breath. EXAM: CHEST - 2 VIEW COMPARISON:  05/07/2023 FINDINGS: The heart size and mediastinal contours are within normal limits. Both lungs are clear. The visualized skeletal structures are unremarkable. IMPRESSION: No active cardiopulmonary disease. Electronically Signed   By: Signa Kell M.D.   On: 07/27/2023 13:17    Microbiology: Results for orders placed or performed during the hospital encounter of 07/27/23  Resp panel by RT-PCR (RSV, Flu A&B, Covid) Anterior Nasal Swab     Status: None   Collection Time: 07/27/23 10:46 AM   Specimen: Anterior Nasal Swab  Result Value Ref Range Status   SARS Coronavirus 2 by RT PCR NEGATIVE NEGATIVE Final    Comment: (NOTE) SARS-CoV-2 target nucleic acids are NOT  DETECTED.  The SARS-CoV-2 RNA is generally detectable in upper respiratory specimens during the acute phase of infection. The lowest concentration of SARS-CoV-2 viral copies this assay can detect is 138 copies/mL. A negative result does not preclude SARS-Cov-2 infection and should not be used as the sole basis for treatment or other patient management decisions. A negative result may occur with  improper specimen collection/handling, submission of specimen other than nasopharyngeal swab, presence of viral mutation(s) within the areas targeted by this assay, and inadequate number of viral copies(<138 copies/mL). A negative result must be combined with clinical observations, patient history, and epidemiological information. The expected result is Negative.  Fact Sheet for Patients:  BloggerCourse.com  Fact Sheet for Healthcare Providers:  SeriousBroker.it  This test is no t yet approved or cleared by the [place] FDA and  has been authorized for detection and/or diagnosis of SARS-CoV-2 by FDA under an Emergency Use Authorization (EUA). This EUA will remain  in effect (meaning this test can be used) for the duration of the COVID-19 declaration under Section 564(b)(1) of the Act, 21 U.S.C.section 360bbb-3(b)(1), unless the authorization is terminated  or revoked sooner.       Influenza A by PCR NEGATIVE NEGATIVE Final   Influenza B by PCR NEGATIVE NEGATIVE Final    Comment: (NOTE) The Xpert Xpress SARS-CoV-2/FLU/RSV plus assay is intended as an aid in the diagnosis of influenza from Nasopharyngeal swab specimens and should not be used as a sole basis for treatment. Nasal washings and aspirates are unacceptable for Xpert Xpress SARS-CoV-2/FLU/RSV testing.  Fact Sheet for Patients: BloggerCourse.com  Fact Sheet for Healthcare Providers: SeriousBroker.it  This test is not yet  approved or cleared by the [place] FDA and has been authorized for detection and/or diagnosis of SARS-CoV-2 by FDA under an Emergency Use Authorization (EUA). This EUA will remain in effect (meaning this test can be used) for the duration of the COVID-19 declaration under Section 564(b)(1) of the Act, 21 U.S.C. section 360bbb-3(b)(1), unless the authorization is terminated or revoked.     Resp Syncytial Virus by PCR NEGATIVE NEGATIVE Final    Comment: (NOTE) Fact Sheet for Patients: BloggerCourse.com  Fact Sheet for Healthcare Providers: SeriousBroker.it  This test is not yet approved or cleared by the [place] FDA and has been authorized for detection and/or diagnosis of SARS-CoV-2 by FDA under an Emergency Use Authorization (EUA). This EUA will remain in effect (meaning this test can be used) for the duration of the COVID-19 declaration under Section 564(b)(1) of the Act, 21 U.S.C. section 360bbb-3(b)(1), unless the authorization is terminated or revoked.  Performed at [facility], [address], [place], [place] [address]     Labs: CBC: Recent Labs  Lab 07/27/23 1732 07/28/23 0557 07/29/23 0543  WBC 10.9* 14.8* 20.5*  NEUTROABS  --  13.7* 19.1*  HGB 14.5 12.7* 12.8*  HCT 47.5 40.6 41.7  MCV 78.0* 77.8* 78.4*  PLT 295 282 272   Basic Metabolic Panel: Recent Labs  Lab 07/27/23 1042 07/28/23 1324 07/29/23 0543  NA 140 135 136  K 3.7 4.3 4.0  CL 103 103 104  CO2 26 23 22   GLUCOSE 113* 124* 142*  BUN 21 29* 36*  CREATININE 1.42* 1.42* 1.35*  CALCIUM 9.5 9.0 9.0   Liver Function Tests: No results for input(s): "AST", "ALT", "ALKPHOS", "BILITOT", "PROT", "ALBUMIN" in the last 168 hours. CBG: No results for input(s): "GLUCAP" in the last 168 hours.  Discharge time spent: greater than 30 minutes.  Signed: Tyrone Nine, MD [facility] 07/30/2023

## 2023-07-30 NOTE — Plan of Care (Signed)

## 2023-07-30 NOTE — Plan of Care (Signed)
